# Patient Record
Sex: Male | Born: 1960 | Race: White | Hispanic: No | Marital: Married | Smoking: Former smoker
Health system: Southern US, Community
[De-identification: ages and names within clinical notes are randomized; demographics above are authoritative.]

## PROBLEM LIST (undated history)

## (undated) DIAGNOSIS — K6289 Other specified diseases of anus and rectum: Secondary | ICD-10-CM

## (undated) DIAGNOSIS — Z8719 Personal history of other diseases of the digestive system: Secondary | ICD-10-CM

## (undated) DIAGNOSIS — Z973 Presence of spectacles and contact lenses: Secondary | ICD-10-CM

## (undated) DIAGNOSIS — I10 Essential (primary) hypertension: Secondary | ICD-10-CM

## (undated) DIAGNOSIS — K649 Unspecified hemorrhoids: Secondary | ICD-10-CM

## (undated) DIAGNOSIS — Z9189 Other specified personal risk factors, not elsewhere classified: Secondary | ICD-10-CM

## (undated) DIAGNOSIS — M1990 Unspecified osteoarthritis, unspecified site: Secondary | ICD-10-CM

## (undated) DIAGNOSIS — F419 Anxiety disorder, unspecified: Secondary | ICD-10-CM

## (undated) HISTORY — PX: TONSILLECTOMY: SUR1361

---

## 2005-02-20 ENCOUNTER — Emergency Department (HOSPITAL_COMMUNITY): Admission: EM | Admit: 2005-02-20 | Discharge: 2005-02-21 | Payer: Self-pay | Admitting: Emergency Medicine

## 2007-05-11 ENCOUNTER — Emergency Department (HOSPITAL_COMMUNITY): Admission: EM | Admit: 2007-05-11 | Discharge: 2007-05-11 | Payer: Self-pay | Admitting: Emergency Medicine

## 2008-06-12 ENCOUNTER — Encounter: Admission: RE | Admit: 2008-06-12 | Discharge: 2008-06-12 | Payer: Self-pay | Admitting: Internal Medicine

## 2008-09-26 ENCOUNTER — Emergency Department (HOSPITAL_COMMUNITY): Admission: EM | Admit: 2008-09-26 | Discharge: 2008-09-26 | Payer: Self-pay | Admitting: Emergency Medicine

## 2010-11-25 NOTE — Assessment & Plan Note (Signed)
[facility]                                 ON-CALL NOTE   Cameron Kemp, Cameron Kemp                         MRN:          [id number]  DATE:05/11/2007                            DOB:          December 19, 1960    I received a call from the emergency room physician at [facility].  She  stated she had a Docia Chuck there who had been evaluated for chest  pain by her.  She stated that she felt Cameron Kemp could be safely  discharged home with an outpatient assessment by Cardiology.  I have  left a message with the office on the after-hours number that he should  be called.  Cameron Kemp stated his preferred phone number for being  reached was [phone].  The emergency room physician did not feel that he  needed further assessment or inpatient evaluation.      Theodore Demark, PA-C  Electronically Signed      Luis Abed, MD, [facility]  Electronically Signed   RB/MedQ  DD: 05/11/2007  DT: 05/12/2007  Job #: [id number]

## 2011-04-22 LAB — URINALYSIS, ROUTINE W REFLEX MICROSCOPIC
Bilirubin Urine: NEGATIVE
Hgb urine dipstick: NEGATIVE
Nitrite: NEGATIVE
Protein, ur: NEGATIVE

## 2011-04-22 LAB — CBC
Hemoglobin: 14.8
MCV: 97.7
Platelets: 168
RBC: 4.29
RDW: 12
WBC: 5.9

## 2011-04-22 LAB — POCT CARDIAC MARKERS
CKMB, poc: <1 — ABNORMAL LOW
CKMB, poc: <1 — ABNORMAL LOW
CKMB, poc: <1 — ABNORMAL LOW
Myoglobin, poc: 41.4
Myoglobin, poc: 47.1
Operator id: 4295
Operator id: 4708
Troponin i, poc: <0.05

## 2011-04-22 LAB — COMPREHENSIVE METABOLIC PANEL
Albumin: 3.7
BUN: 8
GFR calc non Af Amer: >60
Glucose, Bld: 107 — ABNORMAL HIGH
Potassium: 3.9
Total Protein: 6.7

## 2011-04-22 LAB — DIFFERENTIAL
Basophils Absolute: 0.1
Basophils Relative: 1
Eosinophils Absolute: 0.1
Eosinophils Relative: 2
Lymphs Abs: 1.8
Neutro Abs: 3.4
Neutrophils Relative %: 57

## 2011-07-17 ENCOUNTER — Emergency Department (HOSPITAL_COMMUNITY): Payer: Managed Care, Other (non HMO)

## 2011-07-17 ENCOUNTER — Encounter: Payer: Self-pay | Admitting: Cardiology

## 2011-07-17 ENCOUNTER — Other Ambulatory Visit: Payer: Self-pay

## 2011-07-17 ENCOUNTER — Emergency Department (HOSPITAL_COMMUNITY)
Admission: EM | Admit: 2011-07-17 | Discharge: 2011-07-17 | Disposition: A | Discharge status: Home / Self Care | Payer: Managed Care, Other (non HMO) | Attending: Emergency Medicine | Admitting: Emergency Medicine

## 2011-07-17 DIAGNOSIS — F17200 Nicotine dependence, unspecified, uncomplicated: Secondary | ICD-10-CM | POA: Insufficient documentation

## 2011-07-17 DIAGNOSIS — R42 Dizziness and giddiness: Secondary | ICD-10-CM

## 2011-07-17 HISTORY — DX: Essential (primary) hypertension: I10

## 2011-07-17 LAB — COMPREHENSIVE METABOLIC PANEL
AST: 26 U/L (ref 0–37)
Alkaline Phosphatase: 72 U/L (ref 39–117)
BUN: 9 mg/dL (ref 6–23)
CO2: 29 mEq/L (ref 19–32)
Chloride: 101 mEq/L (ref 96–112)
Creatinine, Ser: 0.79 mg/dL (ref 0.50–1.35)
GFR calc Af Amer: >90 mL/min (ref >90)
GFR calc non Af Amer: >90 mL/min (ref >90)
Total Bilirubin: 0.4 mg/dL (ref 0.3–1.2)

## 2011-07-17 LAB — CARDIAC PANEL(CRET KIN+CKTOT+MB+TROPI)
CK, MB: 1.8 ng/mL (ref 0.3–4.0)
Relative Index: INVALID (ref 0.0–2.5)
Total CK: 69 U/L (ref 7–232)
Troponin I: <0.3 ng/mL (ref <0.30)

## 2011-07-17 LAB — CBC
HCT: 42.3 % (ref 39.0–52.0)
Hemoglobin: 15.6 g/dL (ref 13.0–17.0)
MCHC: 36.9 g/dL — ABNORMAL HIGH (ref 30.0–36.0)
Platelets: 130 10*3/uL — ABNORMAL LOW (ref 150–400)
RBC: 4.48 MIL/uL (ref 4.22–5.81)

## 2011-07-17 LAB — DIFFERENTIAL
Eosinophils Relative: 2 % (ref 0–5)
Monocytes Absolute: 0.4 10*3/uL (ref 0.1–1.0)
Neutrophils Relative %: 59 % (ref 43–77)

## 2011-07-17 MED ORDER — MECLIZINE HCL 12.5 MG PO TABS: 12.5000 mg | ORAL_TABLET | Freq: Three times a day (TID) | ORAL | Status: AC | PRN

## 2011-07-17 MED ORDER — MECLIZINE HCL 25 MG PO TABS
25.0000 mg | ORAL_TABLET | Freq: Once | ORAL | Status: AC
  Administered 2011-07-17: 25 mg via ORAL
  Filled 2011-07-17: qty 1

## 2011-07-17 NOTE — ED Provider Notes (Signed)
History     CSN: [id number]  Arrival date & time 07/17/11  1149   First MD Initiated Contact with Patient 07/17/11 1158      Chief Complaint  Patient presents with  . Chest Pain    (Consider location/radiation/quality/duration/timing/severity/associated sxs/prior treatment) HPI Comments: Patient was drinking coffee before heading to work this morning, then suddenly developed the sensation of dizziness.  He felt this way for a while.  When the symptoms persisted he decided to come here.  He is worse when upright, relieved with rest.  He does have a headache.  There is no fever.  No ringing in the ears or hearing loss.  The history is provided by the patient.    Past Medical History  Diagnosis Date  . Hypertension     History reviewed. No pertinent past surgical history.  History reviewed. No pertinent family history.  History  Substance Use Topics  . Smoking status: Current Everyday Smoker -- 1.0 packs/day  . Smokeless tobacco: Not on file  . Alcohol Use:       Review of Systems  All other systems reviewed and are negative.    Allergies  Penicillins  Home Medications  No current outpatient prescriptions on file.  BP 140/86  Pulse 59  Temp(Src) 98.4 F (36.9 C) (Oral)  Resp 18  SpO2 95%  Physical Exam  Nursing note and vitals reviewed. Constitutional: He is oriented to person, place, and time. He appears well-developed and well-nourished. No distress.  HENT:  Head: Normocephalic and atraumatic.  Right Ear: External ear normal.  Left Ear: External ear normal.  Mouth/Throat: Oropharynx is clear and moist.  Eyes: EOM are normal. Pupils are equal, round, and reactive to light.  Neck: Normal range of motion. Neck supple.  Cardiovascular: Normal rate and regular rhythm.  Exam reveals no friction rub.   No murmur heard. Pulmonary/Chest: Effort normal and breath sounds normal. No respiratory distress. He has no wheezes.  Abdominal: Soft. Bowel sounds are  normal. He exhibits no distension. There is no tenderness.  Musculoskeletal: Normal range of motion. He exhibits no edema.  Neurological: He is alert and oriented to person, place, and time. No cranial nerve deficit. Coordination normal.  Skin: Skin is warm and dry. He is not diaphoretic.    ED Course  Procedures (including critical care time)  Labs Reviewed - No data to display No results found.   No diagnosis found.   Date: 07/17/2011  Rate: 55  Rhythm: normal sinus rhythm  QRS Axis: normal  Intervals: normal  ST/T Wave abnormalities: normal  Conduction Disutrbances:none  Narrative Interpretation:   Old EKG Reviewed: none available    MDM  Labs, ekg are okay.  Patient feeling much better.  Symptoms sound more like peripheral vertigo than cardiac.  Had negative stress test 2 years ago.  Will discharge to home, to return prn if worsens.          Geoffery Lyons, MD 07/17/11 1415

## 2011-07-17 NOTE — ED Notes (Signed)
Pt to department via EMS from home c/o chest pain that started this morning. Reports that he went home from work and did not feel any better. Pt denies any n/v or diaphoresis with the pain. Bp- 130/70 Hr- NSR 342 ASA and 1 nitro PTA.

## 2013-10-30 ENCOUNTER — Other Ambulatory Visit: Payer: Self-pay | Admitting: Internal Medicine

## 2013-10-30 DIAGNOSIS — R7401 Elevation of levels of liver transaminase levels: Secondary | ICD-10-CM

## 2013-10-30 DIAGNOSIS — R740 Nonspecific elevation of levels of transaminase and lactic acid dehydrogenase [LDH]: Principal | ICD-10-CM

## 2015-07-06 ENCOUNTER — Ambulatory Visit (INDEPENDENT_AMBULATORY_CARE_PROVIDER_SITE_OTHER): Payer: 59 | Admitting: Family Medicine

## 2015-07-06 VITALS — BP 132/80 | HR 67 | Temp 98.1°F | Resp 18 | Ht 71.0 in | Wt 227.0 lb

## 2015-07-06 DIAGNOSIS — H6691 Otitis media, unspecified, right ear: Secondary | ICD-10-CM

## 2015-07-06 DIAGNOSIS — J209 Acute bronchitis, unspecified: Secondary | ICD-10-CM | POA: Diagnosis not present

## 2015-07-06 DIAGNOSIS — R0981 Nasal congestion: Secondary | ICD-10-CM

## 2015-07-06 DIAGNOSIS — R059 Cough, unspecified: Secondary | ICD-10-CM

## 2015-07-06 DIAGNOSIS — H9203 Otalgia, bilateral: Secondary | ICD-10-CM | POA: Diagnosis not present

## 2015-07-06 DIAGNOSIS — R05 Cough: Secondary | ICD-10-CM | POA: Diagnosis not present

## 2015-07-06 MED ORDER — AZITHROMYCIN 250 MG PO TABS: ORAL_TABLET | ORAL | Status: DC

## 2015-07-06 MED ORDER — BENZONATATE 100 MG PO CAPS: 200.0000 mg | ORAL_CAPSULE | Freq: Two times a day (BID) | ORAL | Status: DC | PRN

## 2015-07-06 NOTE — Progress Notes (Signed)
Chief Complaint:  Chief Complaint  Patient presents with  . Facial Swelling    rt side of face noticed today     HPI: Cameron Kemp is a 54 y.o. male who reports to [facility] today complaining of right sided jaw pain and ear pain with prior 2 week hx of sinusitis and bronchitis. No dental cavities.  He has had no fevers or chill. No swollowing issues. No CP or SOB or voice changes. No strange taste in his mouth He is a smoker.   Past Medical History  Diagnosis Date  . Hypertension   . Emphysema of lung [facility])    Past Surgical History  Procedure Laterality Date  . Tonsillectomy     Social History   Social History  . Marital Status: Single    Spouse Name: N/A  . Number of Children: N/A  . Years of Education: N/A   Social History Main Topics  . Smoking status: Current Every Day Smoker -- 1.00 packs/day  . Smokeless tobacco: None  . Alcohol Use: None  . Drug Use: None  . Sexual Activity: Not Asked   Other Topics Concern  . None   Social History Narrative   Family History  Problem Relation Age of Onset  . Hyperlipidemia Brother   . Hypertension Brother   . Stroke Paternal Grandmother   . Hypertension Brother    Allergies  Allergen Reactions  . Penicillins Rash   Prior to Admission medications   Medication Sig Start Date End Date Taking? Authorizing Provider  hydrochlorothiazide (HYDRODIURIL) 50 MG tablet Take 25 mg by mouth daily.     Yes Historical Provider, MD  metoprolol tartrate (LOPRESSOR) 25 MG tablet Take 25 mg by mouth 2 (two) times daily.     Yes Historical Provider, MD  aspirin EC 81 MG tablet Take 81 mg by mouth daily. Reported on 07/06/2015    Historical Provider, MD  azithromycin (ZITHROMAX) 250 MG tablet Take 2 tabs po now then 1 tab po daily for the next 4 days. 07/06/15   Marbella Markgraf P Kenson Groh, DO  benzonatate (TESSALON) 100 MG capsule Take 2 capsules (200 mg total) by mouth 2 (two) times daily as needed. 07/06/15   Sid Greener P Hadas Jessop, DO  diazepam (VALIUM) 5 MG  tablet Take 2.5 mg by mouth daily as needed. Reported on 07/06/2015    Historical Provider, MD     ROS: The patient denies fevers, chills, night sweats, unintentional weight loss, chest pain, palpitations, wheezing, dyspnea on exertion, nausea, vomiting, abdominal pain, dysuria, hematuria, melena, numbness, weakness, or tingling.  All other systems have been reviewed and were otherwise negative with the exception of those mentioned in the HPI and as above.    PHYSICAL EXAM: Filed Vitals:   07/06/15 1335  BP: 132/80  Pulse: 67  Temp: 98.1 F (36.7 C)  Resp: 18   Body mass index is 31.67 kg/(m^2).   General: Alert, no acute distress HEENT:  Normocephalic, atraumatic, oropharynx patent. EOMI, PERRLA Erythematous throat, no exudates, left TM normal, right TM erythematous, + sinus tenderness, + erythematous/boggy nasal mucosa, + dental caries, no gingival swelling Cardiovascular:  Regular rate and rhythm, no rubs murmurs or gallops.  No Carotid bruits, radial pulse intact. No pedal edema.  Respiratory: Clear to auscultation bilaterally.  No wheezes, rales, or rhonchi.  No cyanosis, no use of accessory musculature Abdominal: No organomegaly, abdomen is soft and non-tender, positive bowel sounds. No masses. Skin: + slight swelling around right ear and  also erythema , no e/o abscess Neurologic: Facial musculature symmetric. Psychiatric: Patient acts appropriately throughout our interaction. Lymphatic: No cervical or submandibular lymphadenopathy Musculoskeletal: Gait intact. No edema, tenderness   LABS: Results for orders placed or performed during the hospital encounter of 07/17/11  CBC  Result Value Ref Range   WBC 5.2 4.0 - 10.5 K/uL   RBC 4.48 4.22 - 5.81 MIL/uL   Hemoglobin 15.6 13.0 - 17.0 g/dL   HCT 42.3 39.0 - 52.0 %   MCV 94.4 78.0 - 100.0 fL   MCH 34.8 (H) 26.0 - 34.0 pg   MCHC 36.9 (H) 30.0 - 36.0 g/dL   RDW 11.7 11.5 - 15.5 %   Platelets 130 (L) 150 - 400 K/uL    Differential  Result Value Ref Range   Neutrophils Relative % 59 43 - 77 %   Neutro Abs 3.1 1.7 - 7.7 K/uL   Lymphocytes Relative 30 12 - 46 %   Lymphs Abs 1.6 0.7 - 4.0 K/uL   Monocytes Relative 8 3 - 12 %   Monocytes Absolute 0.4 0.1 - 1.0 K/uL   Eosinophils Relative 2 0 - 5 %   Eosinophils Absolute 0.1 0.0 - 0.7 K/uL   Basophils Relative 1 0 - 1 %   Basophils Absolute 0.0 0.0 - 0.1 K/uL  Comprehensive metabolic panel  Result Value Ref Range   Sodium 138 135 - 145 mEq/L   Potassium 3.8 3.5 - 5.1 mEq/L   Chloride 101 96 - 112 mEq/L   CO2 29 19 - 32 mEq/L   Glucose, Bld 107 (H) 70 - 99 mg/dL   BUN 9 6 - 23 mg/dL   Creatinine, Ser 0.79 0.50 - 1.35 mg/dL   Calcium 9.1 8.4 - 10.5 mg/dL   Total Protein 7.1 6.0 - 8.3 g/dL   Albumin 3.9 3.5 - 5.2 g/dL   AST 26 0 - 37 U/L   ALT 37 0 - 53 U/L   Alkaline Phosphatase 72 39 - 117 U/L   Total Bilirubin 0.4 0.3 - 1.2 mg/dL   GFR calc non Af Amer >90 >90 mL/min   GFR calc Af Amer >90 >90 mL/min  Cardiac panel(cret kin+cktot+mb+tropi)  Result Value Ref Range   Total CK 69 7 - 232 U/L   CK, MB 1.8 0.3 - 4.0 ng/mL   Troponin I <0.30 <0.30 ng/mL   Relative Index RELATIVE INDEX IS INVALID 0.0 - 2.5     EKG/XRAY:   Primary read interpreted by Dr. Marin Comment at [facility].   ASSESSMENT/PLAN: Encounter Diagnoses  Name Primary?  . Acute bronchitis, unspecified organism Yes  . Sinus congestion   . Otalgia, bilateral   . Acute right otitis media, recurrence not specified, unspecified otitis media type   . Cough    He was recently ill and has a lot of sinus pressure and now has ear pain and some swelling Will presumptively treat for early OM sine TM looks dull and erythematous He has dental caries and has some pain with certain foods/liquids but nothing new PCN allergy and since I think this is sinus related will rx z pack and tessalon perles Fu prn   Gross sideeffects, risk and benefits, and alternatives of medications d/w patient. Patient is  aware that all medications have potential sideeffects and we are unable to predict every sideeffect or drug-drug interaction that may occur.  Hadas Jessop DO  07/06/2015 3:15 PM

## 2015-07-19 ENCOUNTER — Other Ambulatory Visit (HOSPITAL_COMMUNITY): Payer: Self-pay | Admitting: Respiratory Therapy

## 2015-07-19 DIAGNOSIS — R0600 Dyspnea, unspecified: Secondary | ICD-10-CM

## 2015-07-26 ENCOUNTER — Ambulatory Visit (HOSPITAL_COMMUNITY)
Admission: RE | Admit: 2015-07-26 | Discharge: 2015-07-26 | Disposition: A | Discharge status: Home / Self Care | Payer: 59 | Source: Ambulatory Visit | Attending: Internal Medicine | Admitting: Internal Medicine

## 2015-07-26 DIAGNOSIS — R0600 Dyspnea, unspecified: Secondary | ICD-10-CM | POA: Insufficient documentation

## 2015-07-26 MED ORDER — ALBUTEROL SULFATE (2.5 MG/3ML) 0.083% IN NEBU
2.5000 mg | INHALATION_SOLUTION | Freq: Once | RESPIRATORY_TRACT | Status: AC
  Administered 2015-07-26: 2.5 mg via RESPIRATORY_TRACT

## 2015-07-31 LAB — PULMONARY FUNCTION TEST
DL/VA % PRED: 106 %
DL/VA: 5.02 ml/min/mmHg/L
DLCO UNC % PRED: 79 %
DLCO UNC: 26.63 ml/min/mmHg
FEF 25-75 Post: 1.37 L/sec
FEF 25-75 Pre: 0.75 L/sec
FEF2575-%Change-Post: 83 %
FEF2575-%Pred-Post: 39 %
FEF2575-%Pred-Pre: 21 %
FEV1-%CHANGE-POST: 20 %
FEV1-%PRED-PRE: 47 %
FEV1-%Pred-Post: 57 %
FEV1-POST: 2.3 L
FEV1-Pre: 1.9 L
FEV1FVC-%Change-Post: 3 %
FEV1FVC-%Pred-Pre: 69 %
FEV6-%Change-Post: 13 %
FEV6-%PRED-POST: 74 %
FEV6-%Pred-Pre: 65 %
FEV6-POST: 3.69 L
FEV6-PRE: 3.25 L
FEV6FVC-%CHANGE-POST: -2 %
FEV6FVC-%PRED-POST: 93 %
FEV6FVC-%PRED-PRE: 95 %
FVC-%Change-Post: 16 %
FVC-%Pred-Post: 79 %
FVC-%Pred-Pre: 68 %
FVC-Post: 4.12 L
FVC-Pre: 3.54 L
POST FEV6/FVC RATIO: 90 %
PRE FEV6/FVC RATIO: 92 %
Post FEV1/FVC ratio: 56 %
Pre FEV1/FVC ratio: 54 %
RV % PRED: 157 %
RV: 3.38 L
TLC % PRED: 98 %
TLC: 7.04 L

## 2015-08-06 ENCOUNTER — Emergency Department (HOSPITAL_COMMUNITY): Payer: 59

## 2015-08-06 ENCOUNTER — Encounter (HOSPITAL_COMMUNITY): Payer: Self-pay | Admitting: Emergency Medicine

## 2015-08-06 ENCOUNTER — Emergency Department (HOSPITAL_COMMUNITY)
Admission: EM | Admit: 2015-08-06 | Discharge: 2015-08-06 | Disposition: A | Discharge status: Home / Self Care | Payer: 59 | Attending: Emergency Medicine | Admitting: Emergency Medicine

## 2015-08-06 DIAGNOSIS — R42 Dizziness and giddiness: Secondary | ICD-10-CM | POA: Insufficient documentation

## 2015-08-06 DIAGNOSIS — Z79899 Other long term (current) drug therapy: Secondary | ICD-10-CM | POA: Diagnosis not present

## 2015-08-06 DIAGNOSIS — I10 Essential (primary) hypertension: Secondary | ICD-10-CM | POA: Insufficient documentation

## 2015-08-06 DIAGNOSIS — F17200 Nicotine dependence, unspecified, uncomplicated: Secondary | ICD-10-CM | POA: Diagnosis not present

## 2015-08-06 DIAGNOSIS — Z8709 Personal history of other diseases of the respiratory system: Secondary | ICD-10-CM | POA: Insufficient documentation

## 2015-08-06 DIAGNOSIS — Z880 Allergy status to penicillin: Secondary | ICD-10-CM | POA: Diagnosis not present

## 2015-08-06 DIAGNOSIS — Z7982 Long term (current) use of aspirin: Secondary | ICD-10-CM | POA: Diagnosis not present

## 2015-08-06 DIAGNOSIS — R079 Chest pain, unspecified: Secondary | ICD-10-CM | POA: Diagnosis not present

## 2015-08-06 LAB — BASIC METABOLIC PANEL
Anion gap: 9 (ref 5–15)
BUN: 7 mg/dL (ref 6–20)
CO2: 32 mmol/L (ref 22–32)
Calcium: 9.8 mg/dL (ref 8.9–10.3)
Chloride: 95 mmol/L — ABNORMAL LOW (ref 101–111)
Creatinine, Ser: 0.82 mg/dL (ref 0.61–1.24)
GFR calc Af Amer: >60 mL/min (ref >60)
GFR calc non Af Amer: >60 mL/min (ref >60)
Glucose, Bld: 114 mg/dL — ABNORMAL HIGH (ref 65–99)
Potassium: 4.1 mmol/L (ref 3.5–5.1)
Sodium: 136 mmol/L (ref 135–145)

## 2015-08-06 LAB — CBC
HCT: 48.7 % (ref 39.0–52.0)
Hemoglobin: 17 g/dL (ref 13.0–17.0)
MCH: 34 pg (ref 26.0–34.0)
MCHC: 34.9 g/dL (ref 30.0–36.0)
MCV: 97.4 fL (ref 78.0–100.0)
Platelets: 156 10*3/uL (ref 150–400)
RBC: 5 MIL/uL (ref 4.22–5.81)
RDW: 12.1 % (ref 11.5–15.5)
WBC: 6.5 10*3/uL (ref 4.0–10.5)

## 2015-08-06 LAB — I-STAT TROPONIN, ED: Troponin i, poc: 0 ng/mL (ref 0.00–0.08)

## 2015-08-06 NOTE — ED Notes (Signed)
Pt reports cp radiating to arms for "some time now" but sts it got worse today causing him to come home early from work. sts he also got dizzy and felt like he was going to pass out.

## 2015-08-06 NOTE — Discharge Instructions (Signed)
I recommend you start taking a baby aspirin a day and follow up with your cardiologist. Return for any chest pain that last 15 or 20 minutes or longer.

## 2015-08-06 NOTE — ED Notes (Signed)
MD AT PATIENT BEDSIDE AT THIS TIME

## 2015-08-06 NOTE — ED Notes (Signed)
Provided patient with discharge instructions. Pt's visitor (wife?) verbally aggressive to RN at time of discharge. States, "take that IV out that he didn't need anyway, it better not show up on our bill." RN apologized. Pt then began to yell at visitor, then they laughed and apologized to RN. Patient left at this time with all belongings, refused wheelchair.

## 2015-08-06 NOTE — ED Notes (Signed)
Pt's visitor upset with RN d/t wait times, reassured that MD is looking over patient's chart and will be in to see him as soon as he has settled things with other patients. Let patient and family know that a full cardiac work up can take 4-5 hours, especially in a patient with high risk factors. Visitor seems content with this at this time.

## 2015-08-06 NOTE — ED Provider Notes (Signed)
CSN: [id number]     Arrival date & time 08/06/15  1804 History   First MD Initiated Contact with Patient 08/06/15 2119     Chief Complaint  Patient presents with  . Chest Pain  . Dizziness     (Consider location/radiation/quality/duration/timing/severity/associated sxs/prior Treatment) Patient is a 55 y.o. male presenting with chest pain and dizziness. The history is provided by the patient.  Chest Pain Associated symptoms: dizziness   Associated symptoms: no abdominal pain, no back pain, no diaphoresis, no fever, no nausea, no numbness, no shortness of breath, not vomiting and no weakness   Dizziness Associated symptoms: chest pain   Associated symptoms: no nausea, no shortness of breath, no vomiting and no weakness    patient currently being followed by cardiology was referred there by Dr. Maudie Mercury. Patient's been having difficulty for about 2 weeks with the wording pressure sensation in the mid upper chest area. With questionable radiation to the left arm. Patient states he's had discomfort in the left arm for months. Patient quit smoking last night at midnight was his last cigarette. Today patient had episode of dizziness nausea and feeling like he was going to pass out. But did not pass out. His had intermittent chest discomfort lasting at most 3 minutes.  Past Medical History  Diagnosis Date  . Hypertension   . Emphysema of lung [facility])    Past Surgical History  Procedure Laterality Date  . Tonsillectomy     Family History  Problem Relation Age of Onset  . Hyperlipidemia Brother   . Hypertension Brother   . Stroke Paternal Grandmother   . Hypertension Brother    Social History  Substance Use Topics  . Smoking status: Current Every Day Smoker -- 1.00 packs/day  . Smokeless tobacco: None  . Alcohol Use: Yes    Review of Systems  Constitutional: Negative for fever and diaphoresis.  HENT: Negative for congestion.   Respiratory: Negative for shortness of breath.    Cardiovascular: Positive for chest pain.  Gastrointestinal: Negative for nausea, vomiting and abdominal pain.  Genitourinary: Negative for dysuria.  Musculoskeletal: Negative for back pain.  Neurological: Positive for dizziness and light-headedness. Negative for syncope, weakness and numbness.  Hematological: Does not bruise/bleed easily.  Psychiatric/Behavioral: Negative for confusion.      Allergies  Penicillins  Home Medications   Prior to Admission medications   Medication Sig Start Date End Date Taking? Authorizing Provider  aspirin EC 81 MG tablet Take 81 mg by mouth daily. Reported on 07/06/2015   Yes Historical Provider, MD  buPROPion (WELLBUTRIN) 75 MG tablet Take 75 mg by mouth 2 (two) times daily.   Yes Historical Provider, MD  hydrochlorothiazide (HYDRODIURIL) 50 MG tablet Take 25 mg by mouth daily.     Yes Historical Provider, MD  metoprolol tartrate (LOPRESSOR) 25 MG tablet Take 25 mg by mouth 2 (two) times daily.     Yes Historical Provider, MD  azithromycin (ZITHROMAX) 250 MG tablet Take 2 tabs po now then 1 tab po daily for the next 4 days. Patient not taking: Reported on 08/06/2015 07/06/15   Thao P Le, DO  benzonatate (TESSALON) 100 MG capsule Take 2 capsules (200 mg total) by mouth 2 (two) times daily as needed. Patient not taking: Reported on 08/06/2015 07/06/15   Thao P Le, DO  diazepam (VALIUM) 5 MG tablet Take 2.5 mg by mouth daily as needed. Reported on 08/06/2015    Historical Provider, MD   BP 133/86 mmHg  Pulse 66  Temp(Src) 98.5 F (36.9 C) (Oral)  Resp 19  Ht 5\' 10"  (1.778 m)  Wt 102.059 kg  BMI 32.28 kg/m2  SpO2 94% Physical Exam  Constitutional: He is oriented to person, place, and time. He appears well-developed and well-nourished. No distress.  HENT:  Head: Normocephalic.  Mouth/Throat: Oropharynx is clear and moist.  Eyes: Conjunctivae and EOM are normal. Pupils are equal, round, and reactive to light.  Neck: Normal range of motion.   Cardiovascular: Normal rate, regular rhythm and normal heart sounds.   Pulmonary/Chest: Effort normal and breath sounds normal. No respiratory distress.  Abdominal: Soft. Bowel sounds are normal. There is no tenderness.  Musculoskeletal: Normal range of motion.  Neurological: He is alert and oriented to person, place, and time. No cranial nerve deficit. He exhibits normal muscle tone. Coordination normal.  Skin: Skin is warm. No erythema.  Nursing note and vitals reviewed.   ED Course  Procedures (including critical care time) Labs Review Labs Reviewed  BASIC METABOLIC PANEL - Abnormal; Notable for the following:    Chloride 95 (*)    Glucose, Bld 114 (*)    All other components within normal limits  CBC  I-STAT TROPOININ, ED    Imaging Review Dg Chest 2 View  08/06/2015  CLINICAL DATA:  55 year old male with left chest pain for 2 weeks. EXAM: CHEST  2 VIEW COMPARISON:  07/19/2015 and prior exams FINDINGS: The cardiomediastinal silhouette is unremarkable. COPD/ emphysema changes identified. There is no evidence of focal airspace disease, pulmonary edema, suspicious pulmonary nodule/mass, pleural effusion, or pneumothorax. No acute bony abnormalities are identified. IMPRESSION: COPD/emphysema without evidence of acute cardiopulmonary disease. Electronically Signed   By: Margarette Canada M.D.   On: 08/06/2015 18:54   I have personally reviewed and evaluated these images and lab results as part of my medical decision-making.   EKG Interpretation   Date/Time:  Tuesday August 06 2015 18:12:39 EST Ventricular Rate:  60 PR Interval:  164 QRS Duration: 88 QT Interval:  398 QTC Calculation: 398 R Axis:   82 Text Interpretation:  Normal sinus rhythm Normal ECG Confirmed by  Zaineb Nowaczyk  MD, Orphia Mctigue ([id number]) on 08/06/2015 9:36:18 PM      MDM   Final diagnoses:  Chest pain, unspecified chest pain type    Patient currently being followed by cardiology for chest pain. Patient's had 2 weeks  of chest pain similar to what occurred today and had pain in his left arm for a long time. Today's chest pain did seem to radiate more to the left arm. None of the pain is lasted over 3 minutes. Intermittent in nature. He is currently being followed by cardiology has a stress test scheduled. He was referred to them for this chest pain. Patient quit smoking last night at midnight today he had a little bit of nausea and dizziness and felt like he was going to pass out but did not pass out. No significant shortness of breath.   Troponin was negative chest x-ray no acute findings. EKG normal sinus rhythm no acute findings at all.  Patient's chest pain seems to be inconsistent with an acute cardiac event. Patient given precautions to return for any chest pain lasting 15 minutes or longer. Patient will continue his follow-up with his cardiologist for the stress test. Patient will start a baby aspirin a day. Some of the symptoms may have been related to his recent stopping smoking. Patient now nontoxic no acute distress.  Fredia Sorrow, MD 08/06/15 2251

## 2015-08-06 NOTE — ED Notes (Signed)
pts wife upset regarding wait time, states that he has been skipped. Explained how timer starts at check in and there are still 3 people before him. Wife states that is not true, that he has been skipped. Explained wait time again, apologized for length of wait.

## 2015-09-20 ENCOUNTER — Other Ambulatory Visit: Payer: Self-pay | Admitting: Internal Medicine

## 2015-09-20 DIAGNOSIS — R945 Abnormal results of liver function studies: Secondary | ICD-10-CM

## 2016-06-30 ENCOUNTER — Other Ambulatory Visit: Payer: Self-pay | Admitting: Internal Medicine

## 2016-06-30 DIAGNOSIS — R945 Abnormal results of liver function studies: Secondary | ICD-10-CM

## 2016-08-13 HISTORY — PX: COLONOSCOPY: SHX174

## 2016-09-08 ENCOUNTER — Ambulatory Visit: Payer: Self-pay | Admitting: Surgery

## 2016-09-08 NOTE — H&P (Signed)
Cameron Kemp 09/07/2016 4:32 PM Location: [facility] Patient #: [id number] DOB: 07/08/1961 Married / Language: Cleophus Molt / Race: White Male  History of Present Illness Adin Hector MD; 09/08/2016 8:02 AM) The patient is a 56 year old male who presents with a colorectal polyp. Note for "Colorectal polyp": Patient sent for surgical consultation at the request of Dr. Carol Ada with gastroenterology. Concern for distal rectal polyp.  Pleasant 56 year old male one. History of intermittent rectal bleeding. Underwent colonoscopy with numerous polypectomies done. Found to have a mass in his distal rectum. Also symptomatic hemorrhoids. Surgical consultation recommended for removal. Rest of the polyps patient recalls being told were adenomatous and benign. Patient notes his rectal bleeding has gone down since the polypectomies. However he has known as the lump popping out of the anus. He recalls something popping out intermittently for the past decade. He usually moves his bowels once or twice a day. He has never had any treatments for hemorrhoids. No banding or injection. Tries to do occasional wet wipes. He had some mild dyspnea on exertion. Negative pulmonary and cardiac workup. Quit smoking a year ago. Hypertension controlled.  Basic concerns of a prolapsing anal mass that was too sensitive to remove by colonoscopy, surgical consultation requested for removal. No personal nor family history of GI/colon cancer, inflammatory bowel disease, irritable bowel syndrome, allergy such as Celiac Sprue, dietary/dairy problems, colitis, ulcers nor gastritis. No recent sick contacts/gastroenteritis. No travel outside the country. No changes in diet. No dysphagia to solids or liquids. No significant heartburn or reflux. No hematochezia, hematemesis, coffee ground emesis. No evidence of prior gastric/peptic ulceration.  Patient also recalls being told he had a hernia in his  left groin. He is noticed a hernia at his bellybutton as well. He has had that for a while. He thinks the one at the bellybutton has become slightly larger. Gets some intermittent sharp pains in his groins but nothing too severe. That has been going on for the past couple years. He wonders if at some point he should get hernias repaired as well. It is not a priority compared to the prolapsing anal mass   Past Surgical History (April Staton, [place]; 09/07/2016 4:32 PM) Colon Polyp Removal - Colonoscopy Oral Surgery  Diagnostic Studies History (April Staton, [place]; 09/07/2016 4:32 PM) Colonoscopy within last year  Allergies (April Staton, CMA; 09/07/2016 4:33 PM) Penicillins  Medication History (April Staton, CMA; 09/07/2016 4:34 PM) Metoprolol Tartrate (25MG  Tablet, Oral) Active. HydroCHLOROthiazide (25MG  Tablet, Oral) Active. LORazepam (0.5MG  Tablet, Oral) Active. Medications Reconciled  Social History (April Staton, CMA; 09/07/2016 4:32 PM) Alcohol use Moderate alcohol use. Caffeine use Coffee. Illicit drug use Remotely quit drug use. Tobacco use Former smoker.  Family History (April Staton, [place]; 09/07/2016 4:32 PM) Alcohol Abuse Father. Arthritis Brother, Mother. Colon Polyps Brother, Mother. Hypertension Brother, Mother.  Other Problems (April Staton, CMA; 09/07/2016 4:32 PM) Anxiety Disorder Arthritis Back Pain Gastroesophageal Reflux Disease Hemorrhoids High blood pressure Inguinal Hernia Umbilical Hernia Repair     Review of Systems (April Staton CMA; 09/07/2016 4:32 PM) General Present- Night Sweats and Weight Gain. Not Present- Appetite Loss, Chills, Fatigue, Fever and Weight Loss. Skin Present- Change in Wart/Mole and Dryness. Not Present- Hives, Jaundice, New Lesions, Non-Healing Wounds, Rash and Ulcer. HEENT Present- Ringing in the Ears, Seasonal Allergies, Sinus Pain and Wears glasses/contact lenses. Not Present- Earache, Hearing Loss,  Hoarseness, Nose Bleed, Oral Ulcers, Sore Throat, Visual Disturbances and Yellow Eyes. Respiratory Present- Snoring. Not Present- Bloody sputum, Chronic  Cough, Difficulty Breathing and Wheezing. Breast Not Present- Breast Mass, Breast Pain, Nipple Discharge and Skin Changes. Cardiovascular Not Present- Chest Pain, Difficulty Breathing Lying Down, Leg Cramps, Palpitations, Rapid Heart Rate, Shortness of Breath and Swelling of Extremities. Gastrointestinal Present- Bloody Stool and Hemorrhoids. Not Present- Abdominal Pain, Bloating, Change in Bowel Habits, Chronic diarrhea, Constipation, Difficulty Swallowing, Excessive gas, Gets full quickly at meals, Indigestion, Nausea, Rectal Pain and Vomiting. Male Genitourinary Present- Nocturia. Not Present- Blood in Urine, Change in Urinary Stream, Frequency, Impotence, Painful Urination, Urgency and Urine Leakage. Musculoskeletal Present- Back Pain, Joint Pain, Joint Stiffness and Muscle Pain. Not Present- Muscle Weakness and Swelling of Extremities. Neurological Present- Tingling. Not Present- Decreased Memory, Fainting, Headaches, Numbness, Seizures, Tremor, Trouble walking and Weakness. Psychiatric Present- Anxiety. Not Present- Bipolar, Change in Sleep Pattern, Depression, Fearful and Frequent crying. Endocrine Not Present- Cold Intolerance, Excessive Hunger, Hair Changes, Heat Intolerance, Hot flashes and New Diabetes. Hematology Not Present- Blood Thinners, Easy Bruising, Excessive bleeding, Gland problems, HIV and Persistent Infections.  Vitals (April Staton CMA; 09/07/2016 4:34 PM) 09/07/2016 4:34 PM Weight: 233.25 lb Height: 70.5in Body Surface Area: 2.24 m Body Mass Index: 32.99 kg/m  Temp.: 98.28F(Oral)  Pulse: 63 (Regular)  BP: 122/82 (Sitting, Left Arm, Standard)      Physical Exam Adin Hector MD; 09/08/2016 8:03 AM)  General Mental Status-Alert. General Appearance-Not in acute distress, Not  Sickly. Orientation-Oriented X3. Hydration-Well hydrated. Voice-Normal.  Integumentary Global Assessment Upon inspection and palpation of skin surfaces of the - Axillae: non-tender, no inflammation or ulceration, no drainage. and Distribution of scalp and body hair is normal. General Characteristics Temperature - normal warmth is noted.  Head and Neck Head-normocephalic, atraumatic with no lesions or palpable masses. Face Global Assessment - atraumatic, no absence of expression. Neck Global Assessment - no abnormal movements, no bruit auscultated on the right, no bruit auscultated on the left, no decreased range of motion, non-tender. Trachea-midline. Thyroid Gland Characteristics - non-tender.  Eye Eyeball - Left-Extraocular movements intact, No Nystagmus. Eyeball - Right-Extraocular movements intact, No Nystagmus. Cornea - Left-No Hazy. Cornea - Right-No Hazy. Sclera/Conjunctiva - Left-No scleral icterus, No Discharge. Sclera/Conjunctiva - Right-No scleral icterus, No Discharge. Pupil - Left-Direct reaction to light normal. Pupil - Right-Direct reaction to light normal.  ENMT Ears Pinna - Left - no drainage observed, no generalized tenderness observed. Right - no drainage observed, no generalized tenderness observed. Nose and Sinuses External Inspection of the Nose - no destructive lesion observed. Inspection of the nares - Left - quiet respiration. Right - quiet respiration. Mouth and Throat Lips - Upper Lip - no fissures observed, no pallor noted. Lower Lip - no fissures observed, no pallor noted. Nasopharynx - no discharge present. Oral Cavity/Oropharynx - Tongue - no dryness observed. Oral Mucosa - no cyanosis observed. Hypopharynx - no evidence of airway distress observed.  Chest and Lung Exam Inspection Movements - Normal and Symmetrical. Accessory muscles - No use of accessory muscles in breathing. Palpation Palpation of the chest reveals  - Non-tender. Auscultation Breath sounds - Normal and Clear.  Cardiovascular Auscultation Rhythm - Regular. Murmurs & Other Heart Sounds - Auscultation of the heart reveals - No Murmurs and No Systolic Clicks.  Abdomen Inspection Inspection of the abdomen reveals - No Visible peristalsis and No Abnormal pulsations. Umbilicus - No Bleeding, No Urine drainage. Palpation/Percussion Palpation and Percussion of the abdomen reveal - Soft, Non Tender, No Rebound tenderness, No Rigidity (guarding) and No Cutaneous hyperesthesia. Note: Abdomen soft. Nontender, nondistended. No guarding.  Mild diastasis. 2cm periumbilical mass partially reducible consistent with at least umbilical and possible small Swiss cheese ventral type hernia  Male Genitourinary Sexual Maturity Tanner 5 - Adult hair pattern and Adult penile size and shape. Note: Subtle impulse left greater than right suspicious for bilateral inguinal hernias.   Normal external genitalia. Epididymi, testes, and spermatic cords normal without any masses.  Rectal Note: Bulky right posterior internal hemorrhoid grade 3 with pedunculated smooth polyp on it that prolapses easily. Grade 2 right anterior and left lateral hemorrhoids as well. No other rectal masses.  Perianal skin clean with good hygiene. No pruritis ani. No pilonidal disease. No fissure. No abscess/fistula. Normal sphincter tone.  No external hemorrhoids. No condyloma warts. Tolerates digital and anoscopic rectal exam.  Peripheral Vascular Upper Extremity Inspection - Left - No Cyanotic nailbeds, Not Ischemic. Right - No Cyanotic nailbeds, Not Ischemic.  Neurologic Neurologic evaluation reveals -normal attention span and ability to concentrate, able to name objects and repeat phrases. Appropriate fund of knowledge , normal sensation and normal coordination. Mental Status Affect - not angry, not paranoid. Cranial Nerves-Normal  Bilaterally. Gait-Normal.  Neuropsychiatric Mental status exam performed with findings of-able to articulate well with normal speech/language, rate, volume and coherence, thought content normal with ability to perform basic computations and apply abstract reasoning and no evidence of hallucinations, delusions, obsessions or homicidal/suicidal ideation.  Musculoskeletal Global Assessment Spine, Ribs and Pelvis - no instability, subluxation or laxity. Right Upper Extremity - no instability, subluxation or laxity.  Lymphatic Head & Neck  General Head & Neck Lymphatics: Bilateral - Description - No Localized lymphadenopathy. Axillary  General Axillary Region: Bilateral - Description - No Localized lymphadenopathy. Femoral & Inguinal  Generalized Femoral & Inguinal Lymphatics: Left - Description - No Localized lymphadenopathy. Right - Description - No Localized lymphadenopathy.   Results Adin Hector MD; 09/08/2016 8:06 AM) Procedures  Name Value Date Hemorrhoids Procedure Anal exam: External Hemorrhoid Internal exam: Internal Hemorroids ( non-bleeding) prolapse Mass Other: Right posterior grade 3 prolapsing internal hemorrhoid with pedunculated mass on it. Most likely a fibroepithelial polyp could be adenomatous. Right anterior and left lateral grade 2 internal hemorrhoids friable  Performed: 09/07/2016 4:55 PM    Assessment & Plan Adin Hector MD; 09/08/2016 8:06 AM)  PROLAPSED INTERNAL HEMORRHOIDS, GRADE 3 (K64.2) Impression: Rather large right posterior internal hemorrhoid prolapsing with pedunculated mass on it. I think this will require removal. Can most likely due to hemorrhoidal ligation and pexy on the other two hemorrhoids. Should be outpatient surgery. He is interested in proceeding.  Current Plans Pt Education - [facility] Hemorrhoids (Kameisha Malicki): discussed with patient and provided information. Pt Education - Pamphlet Given - The Hemorrhoid Book:  discussed with patient and provided information. You are being scheduled for surgery- Our schedulers will call you.  You should hear from our office's scheduling department within 5 working days about the location, date, and time of surgery. We try to make accommodations for patient's preferences in scheduling surgery, but sometimes the OR schedule or the surgeon's schedule prevents [place] from making those accommodations.  If you have not heard from our office [phone]) in 5 working days, call the office and ask for your surgeon's nurse.  If you have other questions about your diagnosis, plan, or surgery, call the office and ask for your surgeon's nurse.  The anatomy & physiology of the anorectal region was discussed. The pathophysiology of hemorrhoids and differential diagnosis was discussed. Natural history risks without surgery was discussed. I stressed the  importance of a bowel regimen to have daily soft bowel movements to minimize progression of disease. Interventions such as sclerotherapy & banding were discussed.  The patient's symptoms are not adequately controlled by medicines and other non-operative treatments. I feel the risks & problems of no surgery outweigh the operative risks; therefore, I recommended surgery to treat the hemorrhoids by ligation, pexy, and possible resection.  Risks such as bleeding, infection, urinary difficulties, need for further treatment, heart attack, death, and other risks were discussed. I noted a good likelihood this will help address the problem. Goals of post-operative recovery were discussed as well. Possibility that this will not correct all symptoms was explained. Post-operative pain, bleeding, constipation, and other problems after surgery were discussed. We will work to minimize complications. Educational handouts further explaining the pathology, treatment options, and bowel regimen were given as well. Questions were answered. The  patient expresses understanding & wishes to proceed with surgery.  ANOSCOPY, DIAGNOSTIC (46600) PROLAPSED INTERNAL HEMORRHOIDS, GRADE 2 (K64.1) Impression: Right anterior left lateral piles. Hopefully just hemorrhoidal ligation and pexy is adequate for them.  ANAL POLYP (K62.0) Impression: Pedunculated prolapsing mass consistent with the concerning anal canal polyp. Should be able to be removed in conjunction with the grade 3 prolapsing internal hemorrhoid  ENCOUNTER FOR PREOPERATIVE EXAMINATION FOR GENERAL SURGICAL PROCEDURE (Z01.818)  Current Plans You are being scheduled for surgery- Our schedulers will call you.  You should hear from our office's scheduling department within 5 working days about the location, date, and time of surgery. We try to make accommodations for patient's preferences in scheduling surgery, but sometimes the OR schedule or the surgeon's schedule prevents [place] from making those accommodations.  If you have not heard from our office [phone]) in 5 working days, call the office and ask for your surgeon's nurse.  If you have other questions about your diagnosis, plan, or surgery, call the office and ask for your surgeon's nurse.  Pt Education - [facility] Rectal Prep for Anorectal outpatient/office surgery: discussed with patient and provided information. Pt Education - [facility] Rectal Surgery HCI (Dhruti Ghuman): discussed with patient and provided information. INCARCERATED UMBILICAL HERNIA ([id number]) Impression: At least partially incarcerated periumbilical ventral hernia.  Small [place] time. At some point would benefit from surgical repair. His and his young age and obesity and activity level, would do mesh underlay repair to minimize recurrence.  Would treat the prolapsing hemorrhoid with anal polyp first  BILATERAL INGUINAL HERNIA WITHOUT OBSTRUCTION OR GANGRENE, RECURRENCE NOT SPECIFIED (K40.20) Impression: Left greater than right small inguinal hernias on Valsalva with  history suspicious as well.  At some point he would benefit from surgical repair since they are symptomatic and he is only in his early 4s. He is interested in getting them fixed perhaps later in the year but once to address the prolapsing anal mass first. Then wait at least a few months. We can discuss that after dealing with the prolapsing anal mass. Would plan laparoscopic TEP Underlay repair and then probably another sheet of mesh periumbilically  Adin Hector, M.D., F.A.C.S. Gastrointestinal and Minimally Invasive Surgery [facility] [address] [place], [place] [id number] [phone] Main / Paging

## 2016-10-22 ENCOUNTER — Encounter (HOSPITAL_BASED_OUTPATIENT_CLINIC_OR_DEPARTMENT_OTHER): Payer: Self-pay | Admitting: *Deleted

## 2016-10-23 ENCOUNTER — Encounter (HOSPITAL_BASED_OUTPATIENT_CLINIC_OR_DEPARTMENT_OTHER): Payer: Self-pay | Admitting: *Deleted

## 2016-10-23 NOTE — Progress Notes (Signed)
NPO AFTER MN.  ARRIVE AT 0700.  NEEDS ISTAT 8 AND EKG.  WILL TAKE LOPRESSOR AM DOS W/ SIPS OF WATER.  PT VERBALIZED UNDERSTANDING RECTAL PREP INSTRUCTIONS FROM OFFICE.

## 2016-10-23 NOTE — Progress Notes (Signed)
   10/23/16 1122  OBSTRUCTIVE SLEEP APNEA  Have you ever been diagnosed with sleep apnea through a sleep study? No  Do you snore loudly (loud enough to be heard through closed doors)?  1  Do you often feel tired, fatigued, or sleepy during the daytime (such as falling asleep during driving or talking to someone)? 0  Has anyone observed you stop breathing during your sleep? 1  Do you have, or are you being treated for high blood pressure? 1  BMI more than 35 kg/m2? 0  Age > 66 (1-yes) 1  Male Gender (Yes=1) 1  Obstructive Sleep Apnea Score 5

## 2016-10-28 ENCOUNTER — Ambulatory Visit (HOSPITAL_BASED_OUTPATIENT_CLINIC_OR_DEPARTMENT_OTHER): Payer: 59 | Admitting: Anesthesiology

## 2016-10-28 ENCOUNTER — Encounter (HOSPITAL_BASED_OUTPATIENT_CLINIC_OR_DEPARTMENT_OTHER)
Admission: RE | Disposition: A | Discharge status: Home / Self Care | Payer: Self-pay | Source: Ambulatory Visit | Attending: Surgery

## 2016-10-28 ENCOUNTER — Ambulatory Visit (HOSPITAL_BASED_OUTPATIENT_CLINIC_OR_DEPARTMENT_OTHER)
Admission: RE | Admit: 2016-10-28 | Discharge: 2016-10-28 | Disposition: A | Discharge status: Home / Self Care | Payer: 59 | Source: Ambulatory Visit | Attending: Surgery | Admitting: Surgery

## 2016-10-28 ENCOUNTER — Encounter (HOSPITAL_BASED_OUTPATIENT_CLINIC_OR_DEPARTMENT_OTHER): Payer: Self-pay | Admitting: *Deleted

## 2016-10-28 DIAGNOSIS — Z6832 Body mass index (BMI) 32.0-32.9, adult: Secondary | ICD-10-CM | POA: Insufficient documentation

## 2016-10-28 DIAGNOSIS — K642 Third degree hemorrhoids: Secondary | ICD-10-CM | POA: Diagnosis present

## 2016-10-28 DIAGNOSIS — I10 Essential (primary) hypertension: Secondary | ICD-10-CM | POA: Diagnosis not present

## 2016-10-28 DIAGNOSIS — K420 Umbilical hernia with obstruction, without gangrene: Secondary | ICD-10-CM

## 2016-10-28 DIAGNOSIS — Z87891 Personal history of nicotine dependence: Secondary | ICD-10-CM | POA: Insufficient documentation

## 2016-10-28 DIAGNOSIS — Z79899 Other long term (current) drug therapy: Secondary | ICD-10-CM | POA: Diagnosis not present

## 2016-10-28 DIAGNOSIS — E669 Obesity, unspecified: Secondary | ICD-10-CM | POA: Insufficient documentation

## 2016-10-28 DIAGNOSIS — Z8719 Personal history of other diseases of the digestive system: Secondary | ICD-10-CM | POA: Insufficient documentation

## 2016-10-28 DIAGNOSIS — D129 Benign neoplasm of anus and anal canal: Secondary | ICD-10-CM | POA: Diagnosis not present

## 2016-10-28 DIAGNOSIS — J449 Chronic obstructive pulmonary disease, unspecified: Secondary | ICD-10-CM | POA: Insufficient documentation

## 2016-10-28 DIAGNOSIS — K4020 Bilateral inguinal hernia, without obstruction or gangrene, not specified as recurrent: Secondary | ICD-10-CM | POA: Diagnosis not present

## 2016-10-28 DIAGNOSIS — R001 Bradycardia, unspecified: Secondary | ICD-10-CM | POA: Diagnosis not present

## 2016-10-28 DIAGNOSIS — K620 Anal polyp: Secondary | ICD-10-CM

## 2016-10-28 HISTORY — DX: Personal history of other diseases of the digestive system: Z87.19

## 2016-10-28 HISTORY — DX: Other specified diseases of anus and rectum: K62.89

## 2016-10-28 HISTORY — DX: Unspecified hemorrhoids: K64.9

## 2016-10-28 HISTORY — PX: EVALUATION UNDER ANESTHESIA WITH HEMORRHOIDECTOMY: SHX5624

## 2016-10-28 HISTORY — DX: Unspecified osteoarthritis, unspecified site: M19.90

## 2016-10-28 HISTORY — DX: Other specified personal risk factors, not elsewhere classified: Z91.89

## 2016-10-28 HISTORY — DX: Anxiety disorder, unspecified: F41.9

## 2016-10-28 HISTORY — PX: MASS EXCISION: SHX2000

## 2016-10-28 HISTORY — DX: Presence of spectacles and contact lenses: Z97.3

## 2016-10-28 LAB — POCT I-STAT, CHEM 8
BUN: 20 mg/dL (ref 6–20)
CHLORIDE: 101 mmol/L (ref 101–111)
Calcium, Ion: 1.18 mmol/L (ref 1.15–1.40)
Creatinine, Ser: 0.8 mg/dL (ref 0.61–1.24)
Glucose, Bld: 125 mg/dL — ABNORMAL HIGH (ref 65–99)
HEMATOCRIT: 45 % (ref 39.0–52.0)
Hemoglobin: 15.3 g/dL (ref 13.0–17.0)
POTASSIUM: 4.3 mmol/L (ref 3.5–5.1)
SODIUM: 137 mmol/L (ref 135–145)
TCO2: 32 mmol/L (ref 0–100)

## 2016-10-28 SURGERY — EXAM UNDER ANESTHESIA WITH HEMORRHOIDECTOMY
Anesthesia: General | Site: Rectum

## 2016-10-28 MED ORDER — LIDOCAINE 2% (20 MG/ML) 5 ML SYRINGE
INTRAMUSCULAR | Status: AC
  Filled 2016-10-28: qty 5

## 2016-10-28 MED ORDER — PROPOFOL 500 MG/50ML IV EMUL
INTRAVENOUS | Status: DC | PRN
Start: 2016-10-28 — End: 2016-10-28
  Administered 2016-10-28: 50 ug/kg/min via INTRAVENOUS

## 2016-10-28 MED ORDER — NAPROXEN 500 MG PO TABS
500.0000 mg | ORAL_TABLET | Freq: Two times a day (BID) | ORAL | Status: DC | PRN
  Filled 2016-10-28: qty 1

## 2016-10-28 MED ORDER — CHLORHEXIDINE GLUCONATE CLOTH 2 % EX PADS
6.0000 | MEDICATED_PAD | Freq: Once | CUTANEOUS | Status: DC
  Filled 2016-10-28: qty 6

## 2016-10-28 MED ORDER — ROCURONIUM BROMIDE 50 MG/5ML IV SOSY
PREFILLED_SYRINGE | INTRAVENOUS | Status: AC
  Filled 2016-10-28: qty 10

## 2016-10-28 MED ORDER — SUCCINYLCHOLINE CHLORIDE 200 MG/10ML IV SOSY
PREFILLED_SYRINGE | INTRAVENOUS | Status: AC
  Filled 2016-10-28: qty 10

## 2016-10-28 MED ORDER — SODIUM CHLORIDE 0.9% FLUSH
3.0000 mL | Freq: Two times a day (BID) | INTRAVENOUS | Status: DC
  Filled 2016-10-28: qty 3

## 2016-10-28 MED ORDER — BUPIVACAINE LIPOSOME 1.3 % IJ SUSP
INTRAMUSCULAR | Status: AC
  Filled 2016-10-28: qty 20

## 2016-10-28 MED ORDER — OXYCODONE HCL 5 MG PO TABS: 5.0000 mg | ORAL_TABLET | ORAL | 0 refills | Status: DC | PRN

## 2016-10-28 MED ORDER — SODIUM CHLORIDE 0.9% FLUSH
3.0000 mL | INTRAVENOUS | Status: DC | PRN
  Filled 2016-10-28: qty 3

## 2016-10-28 MED ORDER — FENTANYL CITRATE (PF) 100 MCG/2ML IJ SOLN
25.0000 ug | INTRAMUSCULAR | Status: DC | PRN
  Filled 2016-10-28: qty 1

## 2016-10-28 MED ORDER — OXYCODONE HCL 5 MG PO TABS
5.0000 mg | ORAL_TABLET | Freq: Once | ORAL | Status: DC | PRN
  Filled 2016-10-28: qty 1

## 2016-10-28 MED ORDER — LIDOCAINE 2% (20 MG/ML) 5 ML SYRINGE
INTRAMUSCULAR | Status: DC | PRN
  Administered 2016-10-28: 70 mg via INTRAVENOUS

## 2016-10-28 MED ORDER — BUPIVACAINE-EPINEPHRINE 0.25% -1:200000 IJ SOLN
INTRAMUSCULAR | Status: DC | PRN
Start: 2016-10-28 — End: 2016-10-28
  Administered 2016-10-28 (×2): 10 mL

## 2016-10-28 MED ORDER — FENTANYL CITRATE (PF) 100 MCG/2ML IJ SOLN
INTRAMUSCULAR | Status: AC
  Filled 2016-10-28: qty 2

## 2016-10-28 MED ORDER — BUPIVACAINE HCL (PF) 0.25 % IJ SOLN
INTRAMUSCULAR | Status: AC
  Filled 2016-10-28: qty 30

## 2016-10-28 MED ORDER — AMBULATORY NON FORMULARY MEDICATION: 1.0000 "application " | Freq: Four times a day (QID) | 2 refills | Status: DC

## 2016-10-28 MED ORDER — EPHEDRINE SULFATE-NACL 50-0.9 MG/10ML-% IV SOSY
PREFILLED_SYRINGE | INTRAVENOUS | Status: DC | PRN
  Administered 2016-10-28 (×2): 10 mg via INTRAVENOUS

## 2016-10-28 MED ORDER — PROPOFOL 10 MG/ML IV BOLUS
INTRAVENOUS | Status: DC | PRN
  Administered 2016-10-28: 200 mg via INTRAVENOUS

## 2016-10-28 MED ORDER — PROPOFOL 500 MG/50ML IV EMUL
INTRAVENOUS | Status: AC
  Filled 2016-10-28: qty 100

## 2016-10-28 MED ORDER — OXYCODONE HCL 5 MG/5ML PO SOLN
5.0000 mg | Freq: Once | ORAL | Status: DC | PRN
  Filled 2016-10-28: qty 5

## 2016-10-28 MED ORDER — BUPIVACAINE LIPOSOME 1.3 % IJ SUSP
20.0000 mL | INTRAMUSCULAR | Status: DC
  Filled 2016-10-28: qty 20

## 2016-10-28 MED ORDER — MIDAZOLAM HCL 5 MG/5ML IJ SOLN
INTRAMUSCULAR | Status: DC | PRN
  Administered 2016-10-28: 2 mg via INTRAVENOUS

## 2016-10-28 MED ORDER — FENTANYL CITRATE (PF) 100 MCG/2ML IJ SOLN
INTRAMUSCULAR | Status: DC | PRN
  Administered 2016-10-28: 25 ug via INTRAVENOUS
  Administered 2016-10-28: 75 ug via INTRAVENOUS

## 2016-10-28 MED ORDER — ONDANSETRON HCL 4 MG/2ML IJ SOLN
INTRAMUSCULAR | Status: AC
  Filled 2016-10-28: qty 2

## 2016-10-28 MED ORDER — METHOCARBAMOL 1000 MG/10ML IJ SOLN
1000.0000 mg | Freq: Four times a day (QID) | INTRAVENOUS | Status: DC | PRN
  Filled 2016-10-28: qty 10

## 2016-10-28 MED ORDER — SODIUM CHLORIDE 0.9 % IV SOLN
250.0000 mL | INTRAVENOUS | Status: DC | PRN
  Filled 2016-10-28: qty 250

## 2016-10-28 MED ORDER — CLINDAMYCIN PHOSPHATE 900 MG/50ML IV SOLN
INTRAVENOUS | Status: AC
  Filled 2016-10-28: qty 50

## 2016-10-28 MED ORDER — PROPOFOL 10 MG/ML IV BOLUS
INTRAVENOUS | Status: AC
  Filled 2016-10-28: qty 20

## 2016-10-28 MED ORDER — ONDANSETRON HCL 4 MG/2ML IJ SOLN
INTRAMUSCULAR | Status: DC | PRN
  Administered 2016-10-28: 4 mg via INTRAVENOUS

## 2016-10-28 MED ORDER — SUCCINYLCHOLINE CHLORIDE 200 MG/10ML IV SOSY
PREFILLED_SYRINGE | INTRAVENOUS | Status: DC | PRN
  Administered 2016-10-28: 100 mg via INTRAVENOUS

## 2016-10-28 MED ORDER — CELECOXIB 400 MG PO CAPS
400.0000 mg | ORAL_CAPSULE | ORAL | Status: AC
  Administered 2016-10-28: 400 mg via ORAL
  Filled 2016-10-28: qty 1

## 2016-10-28 MED ORDER — PROPOFOL 500 MG/50ML IV EMUL
INTRAVENOUS | Status: AC
  Filled 2016-10-28: qty 50

## 2016-10-28 MED ORDER — ONDANSETRON HCL 4 MG/2ML IJ SOLN
4.0000 mg | Freq: Four times a day (QID) | INTRAMUSCULAR | Status: DC | PRN
  Filled 2016-10-28: qty 2

## 2016-10-28 MED ORDER — DEXAMETHASONE SODIUM PHOSPHATE 4 MG/ML IJ SOLN
INTRAMUSCULAR | Status: DC | PRN
  Administered 2016-10-28: 10 mg via INTRAVENOUS

## 2016-10-28 MED ORDER — GENTAMICIN SULFATE 40 MG/ML IJ SOLN
430.0000 mg | INTRAVENOUS | Status: AC
  Administered 2016-10-28: 430 mg via INTRAVENOUS
  Filled 2016-10-28: qty 10.75

## 2016-10-28 MED ORDER — OXYCODONE HCL 5 MG PO TABS
5.0000 mg | ORAL_TABLET | ORAL | Status: DC | PRN
  Filled 2016-10-28: qty 2

## 2016-10-28 MED ORDER — GABAPENTIN 300 MG PO CAPS
ORAL_CAPSULE | ORAL | Status: AC
  Filled 2016-10-28: qty 1

## 2016-10-28 MED ORDER — EPHEDRINE 5 MG/ML INJ
INTRAVENOUS | Status: AC
  Filled 2016-10-28: qty 10

## 2016-10-28 MED ORDER — DEXAMETHASONE SODIUM PHOSPHATE 10 MG/ML IJ SOLN
INTRAMUSCULAR | Status: AC
  Filled 2016-10-28: qty 1

## 2016-10-28 MED ORDER — DIAZEPAM 5 MG PO TABS: 5.0000 mg | ORAL_TABLET | Freq: Four times a day (QID) | ORAL | 1 refills | Status: DC | PRN

## 2016-10-28 MED ORDER — WITCH HAZEL-GLYCERIN EX PADS
1.0000 "application " | MEDICATED_PAD | CUTANEOUS | Status: DC | PRN
  Filled 2016-10-28: qty 100

## 2016-10-28 MED ORDER — ARTIFICIAL TEARS OP OINT
TOPICAL_OINTMENT | OPHTHALMIC | Status: AC
  Filled 2016-10-28: qty 3.5

## 2016-10-28 MED ORDER — HYDROCORTISONE ACE-PRAMOXINE 2.5-1 % RE CREA
1.0000 "application " | TOPICAL_CREAM | Freq: Four times a day (QID) | RECTAL | Status: DC
  Filled 2016-10-28: qty 30

## 2016-10-28 MED ORDER — SUGAMMADEX SODIUM 200 MG/2ML IV SOLN
INTRAVENOUS | Status: AC
  Filled 2016-10-28: qty 2

## 2016-10-28 MED ORDER — NAPROXEN 500 MG PO TABS: 500.0000 mg | ORAL_TABLET | Freq: Two times a day (BID) | ORAL | 1 refills | Status: DC | PRN

## 2016-10-28 MED ORDER — DIAZEPAM 5 MG PO TABS
5.0000 mg | ORAL_TABLET | Freq: Three times a day (TID) | ORAL | Status: DC | PRN
  Filled 2016-10-28: qty 1

## 2016-10-28 MED ORDER — ACETAMINOPHEN 500 MG PO TABS
ORAL_TABLET | ORAL | Status: AC
  Filled 2016-10-28: qty 2

## 2016-10-28 MED ORDER — ROCURONIUM BROMIDE 10 MG/ML (PF) SYRINGE
PREFILLED_SYRINGE | INTRAVENOUS | Status: DC | PRN
  Administered 2016-10-28: 50 mg via INTRAVENOUS

## 2016-10-28 MED ORDER — LACTATED RINGERS IV SOLN
INTRAVENOUS | Status: DC
  Administered 2016-10-28 (×2): via INTRAVENOUS
  Filled 2016-10-28: qty 1000

## 2016-10-28 MED ORDER — CLINDAMYCIN PHOSPHATE 900 MG/50ML IV SOLN
900.0000 mg | INTRAVENOUS | Status: AC
  Administered 2016-10-28: 900 mg via INTRAVENOUS
  Filled 2016-10-28: qty 50

## 2016-10-28 MED ORDER — BUPIVACAINE-EPINEPHRINE (PF) 0.5% -1:200000 IJ SOLN
INTRAMUSCULAR | Status: AC
  Filled 2016-10-28: qty 30

## 2016-10-28 MED ORDER — DIBUCAINE 1 % RE OINT
TOPICAL_OINTMENT | RECTAL | Status: DC | PRN
  Administered 2016-10-28: 1 via RECTAL

## 2016-10-28 MED ORDER — MIDAZOLAM HCL 2 MG/2ML IJ SOLN
INTRAMUSCULAR | Status: AC
  Filled 2016-10-28: qty 2

## 2016-10-28 MED ORDER — CELECOXIB 200 MG PO CAPS
ORAL_CAPSULE | ORAL | Status: AC
  Filled 2016-10-28: qty 2

## 2016-10-28 MED ORDER — BUPIVACAINE LIPOSOME 1.3 % IJ SUSP
INTRAMUSCULAR | Status: DC | PRN
  Administered 2016-10-28 (×2): 10 mL

## 2016-10-28 MED ORDER — ACETAMINOPHEN 500 MG PO TABS
1000.0000 mg | ORAL_TABLET | ORAL | Status: AC
  Administered 2016-10-28: 1000 mg via ORAL
  Filled 2016-10-28: qty 2

## 2016-10-28 MED ORDER — GABAPENTIN 300 MG PO CAPS
300.0000 mg | ORAL_CAPSULE | ORAL | Status: AC
  Administered 2016-10-28: 300 mg via ORAL
  Filled 2016-10-28: qty 1

## 2016-10-28 MED ORDER — BUPIVACAINE-EPINEPHRINE 0.25% -1:200000 IJ SOLN
INTRAMUSCULAR | Status: AC
  Filled 2016-10-28: qty 1

## 2016-10-28 MED ORDER — DIBUCAINE 1 % RE OINT
TOPICAL_OINTMENT | RECTAL | Status: AC
  Filled 2016-10-28: qty 28

## 2016-10-28 SURGICAL SUPPLY — 52 items
BENZOIN TINCTURE PRP APPL 2/3 (GAUZE/BANDAGES/DRESSINGS) ×3 IMPLANT
BLADE HEX COATED 2.75 (ELECTRODE) ×3 IMPLANT
BLADE SURG 10 STRL SS (BLADE) IMPLANT
BLADE SURG 15 STRL LF DISP TIS (BLADE) ×1 IMPLANT
BLADE SURG 15 STRL SS (BLADE) ×2
BRIEF STRETCH FOR OB PAD LRG (UNDERPADS AND DIAPERS) ×3 IMPLANT
CANISTER SUCTION 1200CC (MISCELLANEOUS) ×3 IMPLANT
COVER BACK TABLE 60X90IN (DRAPES) ×3 IMPLANT
COVER MAYO STAND STRL (DRAPES) ×3 IMPLANT
DECANTER SPIKE VIAL GLASS SM (MISCELLANEOUS) IMPLANT
DRAPE LAPAROTOMY 100X72 PEDS (DRAPES) ×3 IMPLANT
DRAPE LG THREE QUARTER DISP (DRAPES) IMPLANT
ELECT NEEDLE TIP 2.8 STRL (NEEDLE) IMPLANT
ELECT REM PT RETURN 9FT ADLT (ELECTROSURGICAL) ×3
ELECTRODE REM PT RTRN 9FT ADLT (ELECTROSURGICAL) ×1 IMPLANT
GLOVE BIOGEL PI IND STRL 8 (GLOVE) ×1 IMPLANT
GLOVE BIOGEL PI INDICATOR 8 (GLOVE) ×2
GLOVE ECLIPSE 8.0 STRL XLNG CF (GLOVE) ×3 IMPLANT
GOWN STRL REUS W/ TWL LRG LVL3 (GOWN DISPOSABLE) ×1 IMPLANT
GOWN STRL REUS W/ TWL XL LVL3 (GOWN DISPOSABLE) ×1 IMPLANT
GOWN STRL REUS W/TWL LRG LVL3 (GOWN DISPOSABLE) ×2
GOWN STRL REUS W/TWL XL LVL3 (GOWN DISPOSABLE) ×2
KIT RM TURNOVER CYSTO AR (KITS) ×3 IMPLANT
LEGGING LITHOTOMY PAIR STRL (DRAPES) IMPLANT
NEEDLE HYPO 22GX1.5 SAFETY (NEEDLE) ×3 IMPLANT
NS IRRIG 500ML POUR BTL (IV SOLUTION) ×3 IMPLANT
PACK BASIN DAY SURGERY FS (CUSTOM PROCEDURE TRAY) ×3 IMPLANT
PAD ABD 8X10 STRL (GAUZE/BANDAGES/DRESSINGS) ×3 IMPLANT
PENCIL BUTTON HOLSTER BLD 10FT (ELECTRODE) ×3 IMPLANT
SCRUB TECHNI CARE 4 OZ NO DYE (MISCELLANEOUS) ×3 IMPLANT
SHEARS HARMONIC 9CM CVD (BLADE) IMPLANT
SPONGE SURGIFOAM ABS GEL 100 (HEMOSTASIS) IMPLANT
SPONGE SURGIFOAM ABS GEL 12-7 (HEMOSTASIS) IMPLANT
SURGILUBE 2OZ TUBE FLIPTOP (MISCELLANEOUS) ×3 IMPLANT
SUT CHROMIC 2 0 SH (SUTURE) IMPLANT
SUT CHROMIC 3 0 SH 27 (SUTURE) IMPLANT
SUT VIC AB 2-0 SH 27 (SUTURE) ×2
SUT VIC AB 2-0 SH 27XBRD (SUTURE) ×1 IMPLANT
SUT VIC AB 2-0 UR6 27 (SUTURE) IMPLANT
SUT VICRYL 0 UR6 27IN ABS (SUTURE) ×3 IMPLANT
SUT VICRYL AB 2 0 TIE (SUTURE) IMPLANT
SUT VICRYL AB 2 0 TIES (SUTURE)
SYR 20CC LL (SYRINGE) ×3 IMPLANT
SYR BULB IRRIGATION 50ML (SYRINGE) ×3 IMPLANT
SYR CONTROL 10ML LL (SYRINGE) ×3 IMPLANT
TAPE CLOTH 3X10 TAN LF (GAUZE/BANDAGES/DRESSINGS) ×3 IMPLANT
TOWEL OR 17X24 6PK STRL BLUE (TOWEL DISPOSABLE) ×6 IMPLANT
TRAY DSU PREP LF (CUSTOM PROCEDURE TRAY) ×3 IMPLANT
TUBE CONNECTING 12'X1/4 (SUCTIONS) ×1
TUBE CONNECTING 12X1/4 (SUCTIONS) ×2 IMPLANT
UNDERPAD 30X30 INCONTINENT (UNDERPADS AND DIAPERS) ×3 IMPLANT
YANKAUER SUCT BULB TIP NO VENT (SUCTIONS) ×3 IMPLANT

## 2016-10-28 NOTE — Interval H&P Note (Signed)
History and Physical Interval Note:  10/28/2016 8:38 AM  Cameron Kemp  has presented today for surgery, with the diagnosis of Prolapsing anal mass  Symptomatic hemorrhoids  The various methods of treatment have been discussed with the patient and family. After consideration of risks, benefits and other options for treatment, the patient has consented to  Procedure(s): EXAM UNDER ANESTHESIA WITH POSSIBLE HEMORRHOIDECTOMY HEMORRHOIDAL LIGATION PEXY (N/A) EXCISION ANAL MASS (N/A) as a surgical intervention .  The patient's history has been reviewed, patient examined, no change in status, stable for surgery.  I have reviewed the patient's chart and labs.  Questions were answered to the patient's satisfaction.     Ziere Docken C.

## 2016-10-28 NOTE — Anesthesia Postprocedure Evaluation (Signed)
Anesthesia Post Note  Patient: Cameron Kemp  Procedure(s) Performed: Procedure(s) (LRB): EXAM UNDER ANESTHESIA WITH HEMORRHOIDECTOMY HEMORRHOIDAL LIGATION PEXY (N/A) EXCISION ANAL CANAL MASS (N/A)  Patient location during evaluation: PACU Anesthesia Type: General Level of consciousness: awake and alert and patient cooperative Pain management: pain level controlled Vital Signs Assessment: post-procedure vital signs reviewed and stable Respiratory status: spontaneous breathing and respiratory function stable Cardiovascular status: stable Anesthetic complications: no       Last Vitals:  Vitals:   10/28/16 1045 10/28/16 1100  BP: 107/72 113/73  Pulse: (!) 56 (!) 57  Resp: 15 13  Temp:      Last Pain:  Vitals:   10/28/16 0600  TempSrc: Oral                 [name] S

## 2016-10-28 NOTE — H&P (Addendum)
10/28/2016  Cameron Kemp 09/07/2016 4:32 PM Location: [facility] Patient #: [id number] DOB: 1961-02-03 Married / Language: Cleophus Molt / Race: White Male  Patient Care Team: Jani Gravel, MD as PCP - General (Internal Medicine)   History of Present Illness  Patient sent for surgical consultation at the request of Dr. Carol Ada with gastroenterology. Concern for distal rectal polyp.  Pleasant 56 year old male one. History of intermittent rectal bleeding. Underwent colonoscopy with numerous polypectomies done. Found to have a mass in his distal rectum. Also symptomatic hemorrhoids. Surgical consultation recommended for removal. Rest of the polyps patient recalls being told were adenomatous and benign. Patient notes his rectal bleeding has gone down since the polypectomies. However he has known as the lump popping out of the anus. He recalls something popping out intermittently for the past decade. He usually moves his bowels once or twice a day. He has never had any treatments for hemorrhoids. No banding or injection. Tries to do occasional wet wipes. He had some mild dyspnea on exertion. Negative pulmonary and cardiac workup. Quit smoking a year ago. Hypertension controlled.  Basic concerns of a prolapsing anal mass that was too sensitive to remove by colonoscopy, surgical consultation requested for removal. No personal nor family history of GI/colon cancer, inflammatory bowel disease, irritable bowel syndrome, allergy such as Celiac Sprue, dietary/dairy problems, colitis, ulcers nor gastritis. No recent sick contacts/gastroenteritis. No travel outside the country. No changes in diet. No dysphagia to solids or liquids. No significant heartburn or reflux. No hematochezia, hematemesis, coffee ground emesis. No evidence of prior gastric/peptic ulceration.  Patient also recalls being told he had a hernia in his left groin. He is noticed a hernia at his bellybutton  as well. He has had that for a while. He thinks the one at the bellybutton has become slightly larger. Gets some intermittent sharp pains in his groins but nothing too severe. That has been going on for the past couple years. He wonders if at some point he should get hernias repaired as well. It is not a priority compared to the prolapsing anal mass   Past Surgical History (April Staton, [place]; 09/07/2016 4:32 PM) Colon Polyp Removal - Colonoscopy  Oral Surgery   Diagnostic Studies History (April Staton, [place]; 09/07/2016 4:32 PM) Colonoscopy  within last year  Allergies (April Staton, CMA; 09/07/2016 4:33 PM) Penicillins   Medication History (April Staton, CMA; 09/07/2016 4:34 PM) Metoprolol Tartrate (25MG  Tablet, Oral) Active. HydroCHLOROthiazide (25MG  Tablet, Oral) Active. LORazepam (0.5MG  Tablet, Oral) Active. Medications Reconciled  Social History (April Staton, CMA; 09/07/2016 4:32 PM) Alcohol use  Moderate alcohol use. Caffeine use  Coffee. Illicit drug use  Remotely quit drug use. Tobacco use  Former smoker.  Family History (April Staton, [place]; 09/07/2016 4:32 PM) Alcohol Abuse  Father. Arthritis  Brother, Mother. Colon Polyps  Brother, Mother. Hypertension  Brother, Mother.  Other Problems (April Staton, CMA; 09/07/2016 4:32 PM) Anxiety Disorder  Arthritis  Back Pain  Gastroesophageal Reflux Disease  Hemorrhoids  High blood pressure  Inguinal Hernia  Umbilical Hernia Repair     Review of Systems (April Staton CMA; 09/07/2016 4:32 PM) General Present- Night Sweats and Weight Gain. Not Present- Appetite Loss, Chills, Fatigue, Fever and Weight Loss. Skin Present- Change in Wart/Mole and Dryness. Not Present- Hives, Jaundice, New Lesions, Non-Healing Wounds, Rash and Ulcer. HEENT Present- Ringing in the Ears, Seasonal Allergies, Sinus Pain and Wears glasses/contact lenses. Not Present- Earache, Hearing Loss, Hoarseness, Nose Bleed, Oral Ulcers, Sore  Throat, Visual Disturbances  and Yellow Eyes. Respiratory Present- Snoring. Not Present- Bloody sputum, Chronic Cough, Difficulty Breathing and Wheezing. Breast Not Present- Breast Mass, Breast Pain, Nipple Discharge and Skin Changes. Cardiovascular Not Present- Chest Pain, Difficulty Breathing Lying Down, Leg Cramps, Palpitations, Rapid Heart Rate, Shortness of Breath and Swelling of Extremities. Gastrointestinal Present- Bloody Stool and Hemorrhoids. Not Present- Abdominal Pain, Bloating, Change in Bowel Habits, Chronic diarrhea, Constipation, Difficulty Swallowing, Excessive gas, Gets full quickly at meals, Indigestion, Nausea, Rectal Pain and Vomiting. Male Genitourinary Present- Nocturia. Not Present- Blood in Urine, Change in Urinary Stream, Frequency, Impotence, Painful Urination, Urgency and Urine Leakage. Musculoskeletal Present- Back Pain, Joint Pain, Joint Stiffness and Muscle Pain. Not Present- Muscle Weakness and Swelling of Extremities. Neurological Present- Tingling. Not Present- Decreased Memory, Fainting, Headaches, Numbness, Seizures, Tremor, Trouble walking and Weakness. Psychiatric Present- Anxiety. Not Present- Bipolar, Change in Sleep Pattern, Depression, Fearful and Frequent crying. Endocrine Not Present- Cold Intolerance, Excessive Hunger, Hair Changes, Heat Intolerance, Hot flashes and New Diabetes. Hematology Not Present- Blood Thinners, Easy Bruising, Excessive bleeding, Gland problems, HIV and Persistent Infections.  Vitals (April Staton CMA; 09/07/2016 4:34 PM) 09/07/2016 4:34 PM Weight: 233.25 lb Height: 70.5in Body Surface Area: 2.24 m Body Mass Index: 32.99 kg/m  Temp.: 98.1F(Oral)  Pulse: 63 (Regular)  BP: 122/82 (Sitting, Left Arm, Standard)   BP 137/76 (BP Location: Left Arm, Patient Position: Sitting)   Pulse 60   Temp 98.2 F (36.8 C) (Oral)   Resp 18   Ht 5' 10.5" (1.791 m)   Wt 103.2 kg (227 lb 8 oz)   SpO2 97%   BMI 32.18 kg/m       Physical Exam Adin Hector MD; 09/08/2016 8:03 AM) General Mental Status-Alert. General Appearance-Not in acute distress, Not Sickly. Orientation-Oriented X3. Hydration-Well hydrated. Voice-Normal.  Integumentary Global Assessment Upon inspection and palpation of skin surfaces of the - Axillae: non-tender, no inflammation or ulceration, no drainage. and Distribution of scalp and body hair is normal. General Characteristics Temperature - normal warmth is noted.  Head and Neck Head-normocephalic, atraumatic with no lesions or palpable masses. Face Global Assessment - atraumatic, no absence of expression. Neck Global Assessment - no abnormal movements, no bruit auscultated on the right, no bruit auscultated on the left, no decreased range of motion, non-tender. Trachea-midline. Thyroid Gland Characteristics - non-tender.  Eye Eyeball - Left-Extraocular movements intact, No Nystagmus. Eyeball - Right-Extraocular movements intact, No Nystagmus. Cornea - Left-No Hazy. Cornea - Right-No Hazy. Sclera/Conjunctiva - Left-No scleral icterus, No Discharge. Sclera/Conjunctiva - Right-No scleral icterus, No Discharge. Pupil - Left-Direct reaction to light normal. Pupil - Right-Direct reaction to light normal.  ENMT Ears Pinna - Left - no drainage observed, no generalized tenderness observed. Right - no drainage observed, no generalized tenderness observed. Nose and Sinuses External Inspection of the Nose - no destructive lesion observed. Inspection of the nares - Left - quiet respiration. Right - quiet respiration. Mouth and Throat Lips - Upper Lip - no fissures observed, no pallor noted. Lower Lip - no fissures observed, no pallor noted. Nasopharynx - no discharge present. Oral Cavity/Oropharynx - Tongue - no dryness observed. Oral Mucosa - no cyanosis observed. Hypopharynx - no evidence of airway distress observed.  Chest and Lung  Exam Inspection Movements - Normal and Symmetrical. Accessory muscles - No use of accessory muscles in breathing. Palpation Palpation of the chest reveals - Non-tender. Auscultation Breath sounds - Normal and Clear.  Cardiovascular Auscultation Rhythm - Regular. Murmurs & Other Heart Sounds - Auscultation of  the heart reveals - No Murmurs and No Systolic Clicks.  Abdomen Inspection Inspection of the abdomen reveals - No Visible peristalsis and No Abnormal pulsations. Umbilicus - No Bleeding, No Urine drainage. Palpation/Percussion Palpation and Percussion of the abdomen reveal - Soft, Non Tender, No Rebound tenderness, No Rigidity (guarding) and No Cutaneous hyperesthesia. Note: Abdomen soft. Nontender, nondistended. No guarding.   Mild diastasis. 2cm periumbilical mass partially reducible consistent with at least umbilical and possible small Swiss cheese ventral type hernia   Male Genitourinary Sexual Maturity Tanner 5 - Adult hair pattern and Adult penile size and shape. Note: Subtle impulse left greater than right suspicious for bilateral inguinal hernias.   Normal external genitalia. Epididymi, testes, and spermatic cords normal without any masses.   Rectal Note: Bulky right posterior internal hemorrhoid grade 3 with pedunculated smooth polyp on it that prolapses easily. Grade 2 right anterior and left lateral hemorrhoids as well. No other rectal masses.  Perianal skin clean with good hygiene. No pruritis ani. No pilonidal disease. No fissure. No abscess/fistula. Normal sphincter tone.  No external hemorrhoids. No condyloma warts. Tolerates digital and anoscopic rectal exam.   Peripheral Vascular Upper Extremity Inspection - Left - No Cyanotic nailbeds, Not Ischemic. Right - No Cyanotic nailbeds, Not Ischemic.  Neurologic Neurologic evaluation reveals -normal attention span and ability to concentrate, able to name objects and repeat phrases.  Appropriate fund of knowledge , normal sensation and normal coordination. Mental Status Affect - not angry, not paranoid. Cranial Nerves-Normal Bilaterally. Gait-Normal.  Neuropsychiatric Mental status exam performed with findings of-able to articulate well with normal speech/language, rate, volume and coherence, thought content normal with ability to perform basic computations and apply abstract reasoning and no evidence of hallucinations, delusions, obsessions or homicidal/suicidal ideation.  Musculoskeletal Global Assessment Spine, Ribs and Pelvis - no instability, subluxation or laxity. Right Upper Extremity - no instability, subluxation or laxity.  Lymphatic Head & Neck  General Head & Neck Lymphatics: Bilateral - Description - No Localized lymphadenopathy. Axillary  General Axillary Region: Bilateral - Description - No Localized lymphadenopathy. Femoral & Inguinal  Generalized Femoral & Inguinal Lymphatics: Left - Description - No Localized lymphadenopathy. Right - Description - No Localized lymphadenopathy.   Results Adin Hector MD; 09/08/2016 8:06 AM) Procedures  Name Value Date Hemorrhoids Procedure Anal exam: External Hemorrhoid Internal exam: Internal Hemorroids ( non-bleeding) prolapse Mass Other: Right posterior grade 3 prolapsing internal hemorrhoid with pedunculated mass on it. Most likely a fibroepithelial polyp could be adenomatous. Right anterior and left lateral grade 2 internal hemorrhoids friable  Performed: 09/07/2016 4:55 PM    Assessment & Plan  PROLAPSED INTERNAL HEMORRHOIDS, GRADE 3 (K64.2) Impression: Rather large right posterior internal hemorrhoid prolapsing with pedunculated mass on it. I think this will require removal. Can most likely due to hemorrhoidal ligation and pexy on the other two hemorrhoids. Should be outpatient surgery. He is interested in proceeding.  Current Plans Pt Education - [facility] Hemorrhoids  (Jeremian Whitby): discussed with patient and provided information. Pt Education - Pamphlet Given - The Hemorrhoid Book: discussed with patient and provided information. You are being scheduled for surgery- Our schedulers will call you.  You should hear from our office's scheduling department within 5 working days about the location, date, and time of surgery. We try to make accommodations for patient's preferences in scheduling surgery, but sometimes the OR schedule or the surgeon's schedule prevents [place] from making those accommodations.  If you have not heard from our office [phone]) in 5 working days,  call the office and ask for your surgeon's nurse.  If you have other questions about your diagnosis, plan, or surgery, call the office and ask for your surgeon's nurse.  The anatomy & physiology of the anorectal region was discussed. The pathophysiology of hemorrhoids and differential diagnosis was discussed. Natural history risks without surgery was discussed. I stressed the importance of a bowel regimen to have daily soft bowel movements to minimize progression of disease. Interventions such as sclerotherapy & banding were discussed.  The patient's symptoms are not adequately controlled by medicines and other non-operative treatments. I feel the risks & problems of no surgery outweigh the operative risks; therefore, I recommended surgery to treat the hemorrhoids by ligation, pexy, and possible resection.  Risks such as bleeding, infection, urinary difficulties, need for further treatment, heart attack, death, and other risks were discussed. I noted a good likelihood this will help address the problem. Goals of post-operative recovery were discussed as well. Possibility that this will not correct all symptoms was explained. Post-operative pain, bleeding, constipation, and other problems after surgery were discussed. We will work to minimize complications. Educational handouts further  explaining the pathology, treatment options, and bowel regimen were given as well. Questions were answered. The patient expresses understanding & wishes to proceed with surgery.  ANOSCOPY, DIAGNOSTIC (46600) PROLAPSED INTERNAL HEMORRHOIDS, GRADE 2 (K64.1) Impression: Right anterior left lateral piles. Hopefully just hemorrhoidal ligation and pexy is adequate for them. ANAL POLYP (K62.0) Impression: Pedunculated prolapsing mass consistent with the concerning anal canal polyp. Should be able to be removed in conjunction with the grade 3 prolapsing internal hemorrhoid ENCOUNTER FOR PREOPERATIVE EXAMINATION FOR GENERAL SURGICAL PROCEDURE (Z01.818) Current Plans You are being scheduled for surgery- Our schedulers will call you.  You should hear from our office's scheduling department within 5 working days about the location, date, and time of surgery. We try to make accommodations for patient's preferences in scheduling surgery, but sometimes the OR schedule or the surgeon's schedule prevents [place] from making those accommodations.  If you have not heard from our office [phone]) in 5 working days, call the office and ask for your surgeon's nurse.  If you have other questions about your diagnosis, plan, or surgery, call the office and ask for your surgeon's nurse.  Pt Education - [facility] Rectal Prep for Anorectal outpatient/office surgery: discussed with patient and provided information. Pt Education - [facility] Rectal Surgery HCI (Darrol Brandenburg): discussed with patient and provided information. INCARCERATED UMBILICAL HERNIA (P71.0) Impression: At least partially incarcerated periumbilical ventral hernia.  Small [place] time. At some point would benefit from surgical repair. His and his young age and obesity and activity level, would do mesh underlay repair to minimize recurrence.  Would treat the prolapsing hemorrhoid with anal polyp first BILATERAL INGUINAL HERNIA WITHOUT OBSTRUCTION OR GANGRENE,  RECURRENCE NOT SPECIFIED (K40.20) Impression: Left greater than right small inguinal hernias on Valsalva with history suspicious as well.  At some point he would benefit from surgical repair since they are symptomatic and he is only in his early 2s. He is interested in getting them fixed perhaps later in the year but once to address the prolapsing anal mass first. Then wait at least a few months. We can discuss that after dealing with the prolapsing anal mass. Would plan laparoscopic TEP Underlay repair and then probably another sheet of mesh periumbilically   Adin Hector, M.D., F.A.C.S. Gastrointestinal and Minimally Invasive Surgery [facility] [address], [place], [place] [address] [phone]  [phone] Main / Paging  I have re-reviewed the the patient's records, history, medications, and allergies.  I have re-examined the patient.  I again discussed intraoperative plans and goals of post-operative recovery.  The patient agrees to proceed.  Cameron Kemp  [birth year] [id number]  Patient Care Team: Jani Gravel, MD as PCP - General (Internal Medicine)  There are no active problems to display for this patient.   Past Medical History:  Diagnosis Date  . Anxiety   . Arthritis    hands, wrist, elbows  . At risk for sleep apnea    STOP-BANG= 5           SENT TO PCP 10-23-2016  . COPD (chronic obstructive pulmonary disease) with emphysema ([place])   . Hemorrhoids    symptomatic  . History of gastroesophageal reflux (GERD)   . Hypertension   . Mass of perianal area    prolapsed  . Wears glasses     Past Surgical History:  Procedure Laterality Date  . COLONOSCOPY  08/2016  . TONSILLECTOMY  age 23    Social History   Social History  . Marital status: Single    Spouse name: N/A  . Number of children: N/A  . Years of education: N/A   Occupational History  . Not on file.   Social History Main Topics  . Smoking status: Former Smoker     Packs/day: 1.00    Years: 25.00    Types: Cigarettes    Quit date: 07/25/2014  . Smokeless tobacco: Never Used  . Alcohol use 6.0 oz/week    10 Cans of beer per week  . Drug use: No  . Sexual activity: Not on file   Other Topics Concern  . Not on file   Social History Narrative  . No narrative on file    Family History  Problem Relation Age of Onset  . Hyperlipidemia Brother   . Hypertension Brother   . Stroke Paternal Grandmother   . Hypertension Brother     Current Facility-Administered Medications  Medication Dose Route Frequency Provider Last Rate Last Dose  . acetaminophen (TYLENOL) tablet 1,000 mg  1,000 mg Oral On Call to OR Michael Boston, MD      . bupivacaine liposome (EXPAREL) 1.3 % injection 266 mg  20 mL Infiltration On Call to OR Michael Boston, MD      . celecoxib (CELEBREX) 400 MG capsule 400 mg  400 mg Oral On Call to OR Michael Boston, MD      . Chlorhexidine Gluconate Cloth 2 % PADS 6 each  6 each Topical Once Michael Boston, MD       And  . Chlorhexidine Gluconate Cloth 2 % PADS 6 each  6 each Topical Once Michael Boston, MD      . clindamycin (CLEOCIN) IVPB 900 mg  900 mg Intravenous On Call to OR Michael Boston, MD       And  . gentamicin (GARAMYCIN) 5 mg/kg in dextrose 5 % 100 mL IVPB  5 mg/kg Intravenous On Call to OR Michael Boston, MD      . gabapentin (NEURONTIN) capsule 300 mg  300 mg Oral On Call to [place], MD      . lactated ringers infusion   Intravenous Continuous Albertha Ghee, MD         Allergies  Allergen Reactions  . Penicillins Rash    Has patient had a PCN reaction causing immediate rash, facial/tongue/throat swelling, SOB or lightheadedness with hypotension:  YES Has patient had a PCN reaction causing severe rash involving mucus membranes or skin necrosis: NO Has patient had a PCN reaction that required hospitalization NO Has patient had a PCN reaction occurring within the last 10 years: No If all of the above answers are "NO", then may  proceed with Cephalosporin use.    BP 137/76 (BP Location: Left Arm, Patient Position: Sitting)   Pulse 60   Temp 98.2 F (36.8 C) (Oral)   Resp 18   Ht 5' 10.5" (1.791 m)   Wt 103.2 kg (227 lb 8 oz)   SpO2 97%   BMI 32.18 kg/m   Labs: No results found for this or any previous visit (from the past 48 hour(s)).  Imaging / Studies: No results found.   Adin Hector, M.D., F.A.C.S. Gastrointestinal and Minimally Invasive Surgery [facility] [address], [place],  [address] [phone] Main / Paging  10/28/2016 7:01 AM

## 2016-10-28 NOTE — Anesthesia Preprocedure Evaluation (Addendum)
Anesthesia Evaluation  Patient identified by MRN, date of birth, ID band Patient awake    Reviewed: Allergy & Precautions, H&P , NPO status , Patient's Chart, lab work & pertinent test results  Airway Mallampati: III  TM Distance: >3 FB Neck ROM: Full    Dental  (+) Teeth Intact, Dental Advisory Given   Pulmonary COPD, former smoker,    Pulmonary exam normal        Cardiovascular hypertension, Pt. on medications and Pt. on home beta blockers  Rhythm:Regular Rate:Normal  28-Oct-2016  Sinus bradycardia Otherwise normal ECG   Neuro/Psych PSYCHIATRIC DISORDERS Anxiety    GI/Hepatic GERD  ,  Endo/Other  obese  Renal/GU      Musculoskeletal  (+) Arthritis , Osteoarthritis,    Abdominal   Peds  Hematology   Anesthesia Other Findings   Reproductive/Obstetrics                           Anesthesia Physical Anesthesia Plan  ASA: II  Anesthesia Plan: General   Post-op Pain Management:    Induction: Intravenous  Airway Management Planned: Oral ETT  Additional Equipment:   Intra-op Plan:   Post-operative Plan: Extubation in OR  Informed Consent: I have reviewed the patients History and Physical, chart, labs and discussed the procedure including the risks, benefits and alternatives for the proposed anesthesia with the patient or authorized representative who has indicated his/her understanding and acceptance.     Plan Discussed with: CRNA, Anesthesiologist and Surgeon  Anesthesia Plan Comments:        Anesthesia Quick Evaluation

## 2016-10-28 NOTE — Op Note (Signed)
10/28/2016  9:44 AM  PATIENT:  Cameron Kemp  56 y.o. 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 Liposomal bupivacaine (Experel) at the end of the case.  EBL:  Total I/O In: 850 [I.V.:850] Out: 20 [Blood:20].  See operative record  Delay start of Pharmacological VTE agent (>24hrs) due to surgical blood loss or risk of bleeding:  NO  DRAINS: NONE  SPECIMEN:    1.  Posterior midline pedunculated prolapsing adenomatous polyp.   2.  Anal canal fibroepithelial polyps x2.   3.  Right posterior internal/external hemorrhoid. 4.  Left anterior external hemorrhoid  DISPOSITION OF SPECIMEN:  PATHOLOGY  COUNTS:  YES  PLAN OF CARE: Discharge home after PACU  PATIENT DISPOSITION:  PACU - hemodynamically stable.  INDICATION: Pleasant patient with struggles with hemorrhoids.  Found to have prolapsing anal canal masses suspicious for a polyp.  Also with grade 2-3 internal hemorrhoids.   Not able to be managed in the office despite an improved bowel regimen.  I recommended examination under anesthesia and surgical treatment:  The anatomy & physiology of the anorectal region was discussed.  The pathophysiology of hemorrhoids and differential diagnosis was discussed.  Natural history  risks without surgery was discussed.   I stressed the importance of a bowel regimen to have daily soft bowel movements to minimize progression of disease.  Interventions such as sclerotherapy & banding were discussed.  The patient's symptoms are not adequately controlled by medicines and other non-operative treatments.  I feel the risks & problems of no surgery outweigh the operative risks; therefore, I recommended surgery to treat the hemorrhoids by ligation, pexy, and possible resection.  Risks such as bleeding, infection, need for further treatment, heart attack, death, and other risks were discussed.   I noted a good likelihood this will help address the problem.  Goals of post-operative recovery were discussed as well.  Possibility that this will not correct all symptoms was explained.  Post-operative pain, bleeding, constipation, urinary difficulties, and other problems after surgery were discussed.  We will work to minimize complications.   Educational handouts further explaining the pathology, treatment options, and bowel regimen were given as well.  Questions were answered.  The patient expresses understanding & wishes to proceed with surgery.  OR FINDINGS: Pedunculated posterior midline polyp at the anorectal junction.  Excised.  Posterior anal canal smooth fibrous polyps most likely consistent with enlarged hypertrophic anal crypt polyps with some prolapsing.  Excised.  Persistent right posterior internal/external hemorrhoid.  Excised..  Small but irritated left anterior external hemorrhoid excised.  Probable old posterior midline fissure with flat scar.  Closed.  Normal sphincter tone.  No condyloma.  No abscess.  No fistula.  No proctitis.  No rectal prolapse / procidentia.  DESCRIPTION:   Informed consent was confirmed. Patient underwent general anesthesia without difficulty. Patient was placed into prone positioning.  The perianal region was prepped and draped in sterile fashion. Surgical  time-out confirmed our plan.  I did digital rectal examination and then transitioned over to anoscopy to get a sense of the anatomy.  Findings noted above.   I excised the prolapsing adenomatous posterior anal canal polyp.  Almost end up excising the prolapsing anal canal fibroepithelial polyps as well.  One larger resection and one smaller resection done along the posterior circumference, primarily resulting in a longitudinal wound along the right posterior anal canal .  I used a 2-0 Vicryl suture on a UR-6 needle in a figure-of-eight fashion 6 cm proximal to the anal verge. I excised the prolapsing polyps and hemorrhoid involving the posterior circumference prior merely focused on the right posterior midline region in a fusiform biconcave fashion, sparing rectal wall & anoderm to avoid narrowing.  I then ran that stitch longitudinally more distally to close the hemorrhoidectomy wound to the anal verge over a large Hill-Furgeson retarctor to avoid narrowing of the anal canal.  I then tied that stitch down to cause a hemorrhoidopexy.   I did not and excising some posterior midline scar consistent with a prior anal fissure.  Got healthy or anoderm.  I closed the external wounds transversely with 2-0 chromic suture, leaving the last 5 mm right posterior lateral aspect  open to allow natural drainage.    Anteriorly I did do some hemorrhoidal ligations with 2-0 Vicryl suture with pexy on the right anterior and left anterior piles.  Ended up excising a persistent left anterior external hemorrhoidal pile nearly closed that with 2-0 chromic interrupted horizontal mattress suture.      I redid anoscopy examination.   At completion of this, all polyps and grade 3 hemorrhoid had been removed.  The grade 2 hemorrhoids were markedly reduced down to more normal caliber.  There is no prolapse.  External anatomy looked much more normal.  Hemostasis was good.  Patient is being extubated go to go to the recovery room.  I  had discussed postop care in detail with the patient in the preop holding area.  Instructions for post-operative recovery and prescriptions are written. I discussed operative findings, updated the patient's status, discussed probable steps to recovery, and gave postoperative recommendations to the patient's family.  Recommendations were made.  Questions were answered.  They expressed understanding & appreciation.  Adin Hector, M.D., F.A.C.S. Gastrointestinal and Minimally Invasive Surgery [facility] [address], [place], [place] [address] [phone] [address] / Paging

## 2016-10-28 NOTE — Anesthesia Procedure Notes (Signed)
Procedure Name: Intubation Date/Time: 10/28/2016 8:48 AM Performed by: Wanita Chamberlain Pre-anesthesia Checklist: Patient identified, Timeout performed, Emergency Drugs available, Suction available and Patient being monitored Patient Re-evaluated:Patient Re-evaluated prior to inductionOxygen Delivery Method: Circle system utilized Preoxygenation: Pre-oxygenation with 100% oxygen Intubation Type: IV induction Ventilation: Oral airway inserted - appropriate to patient size and Mask ventilation without difficulty Laryngoscope Size: Mac and 4 Grade View: Grade III Tube type: Oral (cricoid manipulation/pressure) Tube size: 8.0 mm Number of attempts: 1 Airway Equipment and Method: Stylet Placement Confirmation: ETT inserted through vocal cords under direct vision,  positive ETCO2 and breath sounds checked- equal and bilateral Secured at: 22 cm Tube secured with: Tape Dental Injury: Teeth and Oropharynx as per pre-operative assessment  Difficulty Due To: Difficulty was anticipated and Difficult Airway- due to reduced neck mobility Future Recommendations: Recommend- induction with short-acting agent, and alternative techniques readily available

## 2016-10-28 NOTE — Discharge Instructions (Signed)
ANORECTAL SURGERY:  °POST OPERATIVE INSTRUCTIONS ° °###################################################################### ° °EAT °Gradually transition to a high fiber diet with a fiber supplement over the next few weeks after discharge.  Start with a pureed / full liquid diet (see below) ° °WALK °Walk an hour a day.  Control your pain to do that.   ° °CONTROL PAIN °Control pain so that you can walk, sleep, tolerate sneezing/coughing, go up/down stairs. ° °HAVE A BOWEL MOVEMENT DAILY °Keep your bowels regular to avoid problems.  OK to try a laxative to override constipation.  OK to use an antidairrheal to slow down diarrhea.  Call if not better after 2 tries ° °CALL IF YOU HAVE PROBLEMS/CONCERNS °Call if you are still struggling despite following these instructions. °Call if you have concerns not answered by these instructions ° °###################################################################### ° ° ° °1. Take your usually prescribed home medications unless otherwise directed. °2. DIET: Follow a light bland diet the first 24 hours after arrival home, such as soup, liquids, crackers, etc.  Be sure to include lots of fluids daily.  Avoid fast food or heavy meals as your are more likely to get nauseated.  Eat a low fat the next few days after surgery.   °3. PAIN CONTROL: °a. Pain is best controlled by a usual combination of three different methods TOGETHER: °i. Ice/Heat °ii. Over the counter pain medication °iii. Prescription pain medication °b. Most patients will experience some swelling and discomfort in the anus/rectal area. and incisions.  Ice packs or heat (30-60 minutes up to 6 times a day) will help. Use ice for the first few days to help decrease swelling and bruising, then switch to heat such as warm towels, sitz baths, warm baths, etc to help relax tight/sore spots and speed recovery.  Some people prefer to use ice alone, heat alone, alternating between ice & heat.  Experiment to what works for you.   Swelling and bruising can take several weeks to resolve.   °c. It is helpful to take an over-the-counter pain medication regularly for the first few weeks.  Choose one of the following that works best for you: °i. Naproxen (Aleve, etc)  Two 220mg tabs twice a day °ii. Ibuprofen (Advil, etc) Three 200mg tabs four times a day (every meal & bedtime) °iii. Acetaminophen (Tylenol, etc) 500-650mg four times a day (every meal & bedtime) °d. A  prescription for pain medication (such as oxycodone, hydrocodone, etc) should be given to you upon discharge.  Take your pain medication as prescribed.  °i. If you are having problems/concerns with the prescription medicine (does not control pain, nausea, vomiting, rash, itching, etc), please call us [phone] to see if we need to switch you to a different pain medicine that will work better for you and/or control your side effect better. °ii. If you need a refill on your pain medication, please contact your pharmacy.  They will contact our office to request authorization. Prescriptions will not be filled after 5 pm or on week-ends. ° °Use a Sitz Bath 4-8 times a day for relief ° ° °Sitz Bath °A sitz bath is a warm water bath taken in the sitting position that covers only the hips and buttocks. It may be used for either healing or hygiene purposes. Sitz baths are also used to relieve pain, itching, or muscle spasms. The water may contain medicine. Moist heat will help you heal and relax.  °HOME CARE INSTRUCTIONS  °Take 3 to 4 sitz baths a day. °1. Fill the bathtub   half full with warm water. °2. Sit in the water and open the drain a little. °3. Turn on the warm water to keep the tub half full. Keep the water running constantly. °4. Soak in the water for 15 to 20 minutes. °5. After the sitz bath, pat the affected area dry first. ° ° °4. KEEP YOUR BOWELS REGULAR °a. The goal is one bowel movement a day °b. Avoid getting constipated.  Between the surgery and the pain medications, it  is common to experience some constipation.  Increasing fluid intake and taking a fiber supplement (such as Metamucil, Citrucel, FiberCon, MiraLax, etc) 1-2 times a day regularly will usually help prevent this problem from occurring.  A mild laxative (prune juice, Milk of Magnesia, MiraLax, etc) should be taken according to package directions if there are no bowel movements after 48 hours. °c. Watch out for diarrhea.  If you have many loose bowel movements, simplify your diet to bland foods & liquids for a few days.  Stop any stool softeners and decrease your fiber supplement.  Switching to mild anti-diarrheal medications (Kayopectate, Pepto Bismol) can help.  If this worsens or does not improve, please call us. ° °5. Wound Care ° °a. Remove your bandages the day after surgery.  Unless discharge instructions indicate otherwise, leave your bandage dry and in place overnight.  Remove the bandage during your first bowel movement.   °b. Wear an absorbent pad or soft cotton gauze in your underwear as needed to catch any drainage and help keep the area  °c. Keep the area clean and dry.  Bathe / shower every day.  Keep the area clean by showering / bathing over the incision / wound.   It is okay to soak an open wound to help wash it.  Wet wipes or showers / gentle washing after bowel movements is often less traumatic than regular toilet paper. °d. You will often notice bleeding with bowel movements.  This should slow down by the end of the first week of surgery °e. Expect some drainage.  This should slow down, too, by the end of the first week of surgery.  Wear an absorbent pad or soft cotton gauze in your underwear until the drainage stops. ° °6. ACTIVITIES as tolerated:   °a. You may resume regular (light) daily activities beginning the next day--such as daily self-care, walking, climbing stairs--gradually increasing activities as tolerated.  If you can walk 30 minutes without difficulty, it is safe to try more intense  activity such as jogging, treadmill, bicycling, low-impact aerobics, swimming, etc. °b. Save the most intensive and strenuous activity for last such as sit-ups, heavy lifting, contact sports, etc  Refrain from any heavy lifting or straining until you are off narcotics for pain control.   °c. DO NOT PUSH THROUGH PAIN.  Let pain be your guide: If it hurts to do something, don't do it.  Pain is your body warning you to avoid that activity for another week until the pain goes down. °d. You may drive when you are no longer taking prescription pain medication, you can comfortably sit for long periods of time, and you can safely maneuver your car and apply brakes. °e. You may have sexual intercourse when it is comfortable.  °7. FOLLOW UP in our office °a. Please call [facility] at [phone] to set up an appointment to see your surgeon in the office for a follow-up appointment approximately 2 weeks after your surgery. °b. Make sure that you call for   this appointment the day you arrive home to insure a convenient appointment time. 10. IF YOU HAVE DISABILITY OR FAMILY LEAVE FORMS, BRING THEM TO THE OFFICE FOR PROCESSING.  DO NOT GIVE THEM TO YOUR DOCTOR.        WHEN TO CALL us [phone]: 1. Poor pain control 2. Reactions / problems with new medications (rash/itching, nausea, etc)  3. Fever over 101.5 F (38.5 C) 4. Inability to urinate 5. Nausea and/or vomiting 6. Worsening swelling or bruising 7. Continued bleeding from incision. 8. Increased pain, redness, or drainage from the incision  The clinic staff is available to answer your questions during regular business hours (8:30am-5pm).  Please dont hesitate to call and ask to speak to one of our nurses for clinical concerns.   A surgeon from [facility] is always on call at the hospitals   If you have a medical emergency, go to the nearest emergency room or call 911.    [facility], [address], [place],  [place], [place]  [address] ? MAIN: [phone]) [phone] ? TOLL FREE: [phone] ? FAX [phone] [email or web address]    HEMORRHOIDS  The rectum is the last foot of your colon, and it naturally stretches to hold stool.  Hemorrhoidal piles are natural clusters of blood vessels that help the rectum and anal canal stretch to hold stool and allow bowel movements to eliminate feces.   Hemorrhoids are abnormally swollen blood vessels in the rectum.  Too much pressure in the rectum causes hemorrhoids by forcing blood to stretch and bulge the walls of the veins, sometimes even rupturing them.  Hemorrhoids can become like varicose veins you might see on a person's legs.  Most people will develop a flare of hemorrhoids in their lifetime.  When bulging hemorrhoidal veins are irritated, they can swell, burn, itch, cause pain, and bleed.  Most flares will calm down gradually own within a few weeks.  However, once hemorrhoids are created, they are difficult to get rid of completely and tend to flare more easily than the first flare.   Fortunately, good habits and simple medical treatment usually control hemorrhoids well, and surgery is needed only in severe cases. Types of Hemorrhoids:  Internal hemorrhoids usually don't initially hurt or itch; they are deep inside the rectum and usually have no sensation. If they begin to push out (prolapse), pain and burning can occur.  However, internal hemorrhoids can bleed.  Anal bleeding should not be ignored since bleeding could come from a dangerous source like colorectal cancer, so persistent rectal bleeding should be investigated by a doctor, sometimes with a colonoscopy.  External hemorrhoids cause most of the symptoms - pain, burning, and itching. Nonirritated hemorrhoids can look like small skin tags coming out of the anus.   Thrombosed hemorrhoids can form when a hemorrhoid blood vessel bursts and causes the hemorrhoid to suddenly swell.  A purple blood clot can  form in it and become an excruciatingly painful lump at the anus. Because of these unpleasant symptoms, immediate incision and drainage by a surgeon at an office visit can provide much relief of the pain.    PREVENTION Avoiding the most frequent causes listed below will prevent most cases of hemorrhoids: Constipation Hard stools Diarrhea  Constant sitting  Straining with bowel movements Sitting on the toilet for a long time  Severe coughing  episodes Pregnancy / Childbirth  Heavy Lifting  Sometimes avoiding the above triggers is difficult:  How can you avoid sitting  all day if you have a seated job? Also, we try to avoid coughing and diarrhea, but sometimes its beyond your control.  Still, there are some practical hints to help: Keep the anal and genital area clean.  Moistened tissues such as flushable wet wipes are less irritating than toilet paper.  Using irrigating showers or bottle irrigation washing gently cleans this sensitive area.   Avoid dry toilet paper when cleaning after bowel movements.  Marland Kitchen Keep the anal and genital area dry.  Lightly pat the rectal area dry.  Avoid rubbing.  Talcum or baby powders can help GET YOUR STOOLS SOFT.   This is the most important way to prevent irritated hemorrhoids.  Hard stools are like sandpaper to the anorectal canal and will cause more problems.  The goal: ONE SOFT BOWEL MOVEMENT A DAY!  BMs from every other day to 3 times a day is a tolerable range Treat coughing, diarrhea and constipation early since irritated hemorrhoids may soon follow.  If your main job activity is seated, always stand or walk during your breaks. Make it a point to stand and walk at least 5 minutes every hour and try to shift frequently in your chair to avoid direct rectal pressure.  Always exhale as you strain or lift. Don't hold your breath.  Do not delay or try to prevent a bowel movement when the urge is present. Exercise regularly (walking or jogging 60 minutes a day) to  stimulate the bowels to move. No reading or other activity while on the toilet. If bowel movements take longer than 5 minutes, you are too constipated. AVOID CONSTIPATION Drink plenty of liquids (1 1/2 to 2 quarts of water and other fluids a day unless fluid restricted for another medical condition). Liquids that contain caffeine (coffee a, tea, soft drinks) can be dehydrating and should be avoided until constipation is controlled. Consider minimizing milk, as dairy products may be constipating. Eat plenty of fiber (30g a day ideal, more if needed).  Fiber is the undigested part of plant food that passes into the colon, acting as natures broom to encourage bowel motility and movement.  Fiber can absorb and hold large amounts of water. This results in a larger, bulkier stool, which is soft and easier to pass.  Eating foods high in fiber - 12 servings - such as  Vegetables: Root (potatoes, carrots, turnips), Leafy green (lettuce, salad greens, celery, spinach), High residue (cabbage, broccoli, etc.) Fruit: Fresh, Dried (prunes, apricots, cherries), Stewed (applesauce)  Whole grain breads, pasta, whole wheat Bran cereals, muffins, etc. Consider adding supplemental bulking fiber which retains large volumes of water: Psyllium ground seeds (native plant from [place])--available as Metamucil, Konsyl, Effersyllium, Per Diem Fiber, or the less expensive generic forms.  Citrucel  (methylcellulose wood fiber) . FiberCon (Polycarbophil) Polyethylene Glycol - and artificial fiber commonly called Miralax or Glycolax.  It is helpful for people with gassy or bloated feelings with regular fiber Flax Seed - a less gassy natural fiber  Laxatives can be useful for a short period if constipation is severe Osmotics (Milk of Magnesia, Fleets Phospho-Soda, Magnesium Citrate)  Stimulants (Senokot,   Castor Oil,  Dulcolax, Ex-Lax)    Laxatives are not a good long-term solution as it can stress the bowels and cause  too much mineral loss and dehydration.   Avoid taking laxatives for more than 7 days in a row.  AVOID DIARRHEA Switch to liquids and simpler foods for a few days to avoid stressing your intestines further.  Avoid dairy products (especially milk & ice cream) for a short time.  The intestines often can lose the ability to digest lactose when stressed. Avoid foods that cause gassiness or bloating.  Typical foods include beans and other legumes, cabbage, broccoli, and dairy foods.  Every person has some sensitivity to other foods, so listen to your body and avoid those foods that trigger problems for you. Adding fiber (Citrucel, Metamucil, FiberCon, Flax seed, Miralax) gradually can help thicken stools by absorbing excess fluid and retrain the intestines to act more normally.  Slowly increase the dose over a few weeks.  Too much fiber too soon can backfire and cause cramping & bloating. Probiotics (such as active yogurt, Align, etc) may help repopulate the intestines and colon with normal bacteria and calm down a sensitive digestive tract.  Most studies show it to be of mild help, though, and such products can be costly. Medicines: Bismuth subsalicylate (ex. Kayopectate, Pepto Bismol) every 30 minutes for up to 6 doses can help control diarrhea.  Avoid if pregnant. Loperamide (Immodium) can slow down diarrhea.  Start with two tablets (4mg  total) first and then try one tablet every 6 hours.  Avoid if you are having fevers or severe pain.  If you are not better or start feeling worse, stop all medicines and call your doctor for advice Call your doctor if you are getting worse or not better.  Sometimes further testing (cultures, endoscopy, X-ray studies, bloodwork, etc) may be needed to help diagnose and treat the cause of the diarrhea.  TROUBLESHOOTING IRREGULAR BOWELS 1) Avoid extremes of bowel movements (no bad constipation/diarrhea) 2) Miralax 17gm mixed in 8oz. water or juice-daily. May use BID as  needed.  3) Gas-x,Phazyme, etc. as needed for gas & bloating.  4) Soft,bland diet. No spicy,greasy,fried foods.  5) Prilosec over-the-counter as needed  6) May hold gluten/wheat products from diet to see if symptoms improve.  7)  May try probiotics (Align, Activa, etc) to help calm the bowels down 7) If symptoms become worse call back immediately.   TREATMENT OF HEMORRHOID FLARE If these preventive measures fail, you must take action right away! Hemorrhoids are one condition that can be mild in the morning and become intolerable by nightfall. Most hemorrhoidal flares take several weeks to calm down.  These suggestions can help: Warm soaks.  This helps more than any topical medication.  Use up to 8 times a day.  Usually sitz baths or sitting in a warm bathtub helps.  Sitting on moist warm towels are helpful.  Switching to ice packs/cool compresses can be helpful  Use a Sitz Bath 4-8 times a day for relief A sitz bath is a warm water bath taken in the sitting position that covers only the hips and buttocks. It may be used for either healing or hygiene purposes. Sitz baths are also used to relieve pain, itching, or muscle spasms. The water may contain medicine. Moist heat will help you heal and relax.  HOME CARE INSTRUCTIONS  Take 3 to 4 sitz baths a day. 6. Fill the bathtub half full with warm water. 7. Sit in the water and open the drain a little. 8. Turn on the warm water to keep the tub half full. Keep the water running constantly. 9. Soak in the water for 15 to 20 minutes. 10. After the sitz bath, pat the affected area dry first. SEEK MEDICAL CARE IF:  You get worse instead of better. Stop the sitz baths if you get worse.  Normalize your bowels.  Extremes of diarrhea or constipation will make hemorrhoids worse.  One soft bowel movement a day is the goal.  Fiber can help get your bowels regular Wet wipes instead of toilet paper Pain control with a NSAID such as ibuprofen (Advil) or  naproxen (Aleve) or acetaminophen (Tylenol) around the clock.  Narcotics are constipating and should be minimized if possible Topical creams contain steroids (bydrocortisone) or local anesthetic (xylocaine) can help make pain and itching more tolerable.   EVALUATION If hemorrhoids are still causing problems, you could benefit by an evaluation by a surgeon.  The surgeon will obtain a history and examine you.  If hemorrhoids are diagnosed, some therapies can be offered in the office, usually with an anoscope into the less sensitive area of the rectum: -injection of hemorrhoids (sclerotherapy) can scar the blood vessels of the swollen/enlarged hemorrhoids to help shrink them down to a more normal size -rubber banding of the enlarged hemorrhoids to help shrink them down to a more normal size -drainage of the blood clot causing a thrombosed hemorrhoid,  to relieve the severe pain   While 90% of the time such problems from hemorrhoids can be managed without preceding to surgery, sometimes the hemorrhoids require a operation to control the problem (uncontrolled bleeding, prolapse, pain, etc.).   This involves being placed under general anesthesia where the surgeon can confirm the diagnosis and remove, suture, or staple the hemorrhoid(s).  Your surgeon can help you treat the problem appropriately.    Post Anesthesia Home Care Instructions  Activity: Get plenty of rest for the remainder of the day. A responsible individual must stay with you for 24 hours following the procedure.  For the next 24 hours, DO NOT: -Drive a car -Paediatric nurse -Drink alcoholic beverages -Take any medication unless instructed by your physician -Make any legal decisions or sign important papers.  Meals: Start with liquid foods such as gelatin or soup. Progress to regular foods as tolerated. Avoid greasy, spicy, heavy foods. If nausea and/or vomiting occur, drink only clear liquids until the nausea and/or vomiting subsides.  Call your physician if vomiting continues.  Special Instructions/Symptoms: Your throat may feel dry or sore from the anesthesia or the breathing tube placed in your throat during surgery. If this causes discomfort, gargle with warm salt water. The discomfort should disappear within 24 hours.  If you had a scopolamine patch placed behind your ear for the management of post- operative nausea and/or vomiting:  1. The medication in the patch is effective for 72 hours, after which it should be removed.  Wrap patch in a tissue and discard in the trash. Wash hands thoroughly with soap and water. 2. You may remove the patch earlier than 72 hours if you experience unpleasant side effects which may include dry mouth, dizziness or visual disturbances. 3. Avoid touching the patch. Wash your hands with soap and water after contact with the patch.   Information for Discharge Teaching: EXPAREL (bupivacaine liposome injectable suspension)   Your surgeon gave you EXPAREL(bupivacaine) in your surgical incision to help control your pain after surgery.   EXPAREL is a local anesthetic that provides pain relief by numbing the tissue around the surgical site.  EXPAREL is designed to release pain medication over time and can control pain for up to 72 hours.  Depending on how you respond to EXPAREL, you may require less pain medication during your recovery.  Possible side effects:  Temporary loss of sensation or ability to move in  the area where bupivacaine was injected.  Nausea, vomiting, constipation  Rarely, numbness and tingling in your mouth or lips, lightheadedness, or anxiety may occur.  Call your doctor right away if you think you may be experiencing any of these sensations, or if you have other questions regarding possible side effects.  Follow all other discharge instructions given to you by your surgeon or nurse. Eat a healthy diet and drink plenty of water or other fluids.  If you return to the  hospital for any reason within 96 hours following the administration of EXPAREL, please inform your health care providers.

## 2016-10-28 NOTE — Transfer of Care (Signed)
Immediate Anesthesia Transfer of Care Note  Patient: Cameron Kemp  Procedure(s) Performed: Procedure(s): EXAM UNDER ANESTHESIA WITH HEMORRHOIDECTOMY HEMORRHOIDAL LIGATION PEXY (N/A) EXCISION ANAL CANAL MASS (N/A)  Patient Location: PACU  Anesthesia Type:General  Level of Consciousness: awake, alert , oriented and patient cooperative  Airway & Oxygen Therapy: Patient Spontanous Breathing and Patient connected to nasal cannula oxygen  Post-op Assessment: Report given to RN and Post -op Vital signs reviewed and stable  Post vital signs: Reviewed and stable  Last Vitals:  Vitals:   10/28/16 0600  BP: 137/76  Pulse: 60  Resp: 18  Temp: 36.8 C    Last Pain:  Vitals:   10/28/16 0600  TempSrc: Oral      Patients Stated Pain Goal: 7 (73/71/06 2694)  Complications: No apparent anesthesia complications

## 2016-10-29 ENCOUNTER — Encounter (HOSPITAL_BASED_OUTPATIENT_CLINIC_OR_DEPARTMENT_OTHER): Payer: Self-pay | Admitting: Surgery

## 2016-11-23 ENCOUNTER — Ambulatory Visit: Payer: Self-pay | Admitting: Surgery

## 2017-01-07 ENCOUNTER — Ambulatory Visit: Payer: Self-pay | Admitting: Surgery

## 2017-02-16 NOTE — Patient Instructions (Signed)
HILBERT BRIGGS  02/16/2017   Your procedure is scheduled on: 03/03/2017    Report to [facility] Main  Entrance Take [place]  elevators to 3rd floor to  [place] at    [place] AM.    Call this number if you have problems the morning of surgery [phone]    Remember: ONLY 1 PERSON MAY GO WITH YOU TO SHORT STAY TO GET  READY MORNING OF [place].  Do not eat food or drink liquids :After Midnight.     Take these medicines the morning of surgery with A SIP OF WATER: metoprolol ( Lopressor)                                 You may not have any metal on your body including hair pins and              piercings  Do not wear jewelry, , lotions, powders or perfumes, deodorant             .              Men may shave face and neck.   Do not bring valuables to the hospital. [place].  Contacts, dentures or bridgework may not be worn into surgery.      Patients discharged the day of surgery will not be allowed to drive home.  Name and phone number of your driver:                Please read over the following fact sheets you were given: _____________________________________________________________________             [facility] - Preparing for Surgery Before surgery, you can play an important role.  Because skin is not sterile, your skin needs to be as free of germs as possible.  You can reduce the number of germs on your skin by washing with CHG (chlorahexidine gluconate) soap before surgery.  CHG is an antiseptic cleaner which kills germs and bonds with the skin to continue killing germs even after washing. Please DO NOT use if you have an allergy to CHG or antibacterial soaps.  If your skin becomes reddened/irritated stop using the CHG and inform your nurse when you arrive at Short Stay. Do not shave (including legs and underarms) for at least 48 hours prior to the first CHG shower.  You may shave your  face/neck. Please follow these instructions carefully:  1.  Shower with CHG Soap the night before surgery and the  morning of Surgery.  2.  If you choose to wash your hair, wash your hair first as usual with your  normal  shampoo.  3.  After you shampoo, rinse your hair and body thoroughly to remove the  shampoo.                           4.  Use CHG as you would any other liquid soap.  You can apply chg directly  to the skin and wash                       Gently with a scrungie or clean washcloth.  5.  Apply the CHG Soap to your body ONLY FROM THE NECK DOWN.   Do not use on face/ open                           Wound or open sores. Avoid contact with eyes, ears mouth and genitals (private parts).                       Wash face,  Genitals (private parts) with your normal soap.             6.  Wash thoroughly, paying special attention to the area where your surgery  will be performed.  7.  Thoroughly rinse your body with warm water from the neck down.  8.  DO NOT shower/wash with your normal soap after using and rinsing off  the CHG Soap.                9.  Pat yourself dry with a clean towel.            10.  Wear clean pajamas.            11.  Place clean sheets on your bed the night of your first shower and do not  sleep with pets. Day of Surgery : Do not apply any lotions/deodorants the morning of surgery.  Please wear clean clothes to the hospital/surgery center.  FAILURE TO FOLLOW THESE INSTRUCTIONS MAY RESULT IN THE CANCELLATION OF YOUR SURGERY PATIENT SIGNATURE_________________________________  NURSE SIGNATURE__________________________________  ________________________________________________________________________

## 2017-02-19 ENCOUNTER — Encounter (HOSPITAL_COMMUNITY)
Admission: RE | Admit: 2017-02-19 | Discharge: 2017-02-19 | Disposition: A | Discharge status: Home / Self Care | Payer: 59 | Source: Ambulatory Visit | Attending: Surgery | Admitting: Surgery

## 2017-02-19 ENCOUNTER — Encounter (HOSPITAL_COMMUNITY): Payer: Self-pay

## 2017-02-19 DIAGNOSIS — K432 Incisional hernia without obstruction or gangrene: Secondary | ICD-10-CM | POA: Diagnosis not present

## 2017-02-19 DIAGNOSIS — K4020 Bilateral inguinal hernia, without obstruction or gangrene, not specified as recurrent: Secondary | ICD-10-CM | POA: Diagnosis not present

## 2017-02-19 DIAGNOSIS — Z01818 Encounter for other preprocedural examination: Secondary | ICD-10-CM | POA: Diagnosis present

## 2017-02-19 LAB — CBC
HEMATOCRIT: 42.7 % (ref 39.0–52.0)
Hemoglobin: 15.2 g/dL (ref 13.0–17.0)
MCH: 33.8 pg (ref 26.0–34.0)
MCHC: 35.6 g/dL (ref 30.0–36.0)
MCV: 94.9 fL (ref 78.0–100.0)
Platelets: 165 10*3/uL (ref 150–400)
RBC: 4.5 MIL/uL (ref 4.22–5.81)
RDW: 12.5 % (ref 11.5–15.5)
WBC: 6.4 10*3/uL (ref 4.0–10.5)

## 2017-02-19 LAB — BASIC METABOLIC PANEL
Anion gap: 8 (ref 5–15)
BUN: 13 mg/dL (ref 6–20)
CHLORIDE: 103 mmol/L (ref 101–111)
CO2: 28 mmol/L (ref 22–32)
Calcium: 9.3 mg/dL (ref 8.9–10.3)
Creatinine, Ser: 0.81 mg/dL (ref 0.61–1.24)
GFR calc non Af Amer: >60 mL/min (ref >60)
Glucose, Bld: 123 mg/dL — ABNORMAL HIGH (ref 65–99)
Potassium: 3.6 mmol/L (ref 3.5–5.1)
SODIUM: 139 mmol/L (ref 135–145)

## 2017-02-19 NOTE — Progress Notes (Signed)
ekg-10/28/16-epic

## 2017-02-19 NOTE — Progress Notes (Signed)
10/28/16-ekg-epic

## 2017-03-02 MED ORDER — CLINDAMYCIN PHOSPHATE 900 MG/50ML IV SOLN
900.0000 mg | INTRAVENOUS | Status: AC
  Administered 2017-03-03: 900 mg via INTRAVENOUS
  Filled 2017-03-02: qty 50

## 2017-03-02 MED ORDER — GENTAMICIN SULFATE 40 MG/ML IJ SOLN
420.0000 mg | INTRAVENOUS | Status: AC
  Administered 2017-03-03: 420 mg via INTRAVENOUS
  Filled 2017-03-02: qty 10.5

## 2017-03-02 NOTE — Anesthesia Preprocedure Evaluation (Addendum)
Anesthesia Evaluation  Patient identified by MRN, date of birth, ID band Patient awake    Reviewed: Allergy & Precautions, H&P , NPO status , Patient's Chart, lab work & pertinent test results  Airway Mallampati: III  TM Distance: >3 FB Neck ROM: Full    Dental no notable dental hx. (+) Teeth Intact, Dental Advisory Given   Pulmonary neg pulmonary ROS, former smoker,    Pulmonary exam normal breath sounds clear to auscultation       Cardiovascular Exercise Tolerance: Good hypertension, Pt. on medications and Pt. on home beta blockers  Rhythm:Regular Rate:Normal     Neuro/Psych Anxiety negative neurological ROS  negative psych ROS   GI/Hepatic negative GI ROS, Neg liver ROS,   Endo/Other  negative endocrine ROS  Renal/GU negative Renal ROS  negative genitourinary   Musculoskeletal  (+) Arthritis , Osteoarthritis,    Abdominal   Peds  Hematology negative hematology ROS (+)   Anesthesia Other Findings   Reproductive/Obstetrics negative OB ROS                            Anesthesia Physical Anesthesia Plan  ASA: II  Anesthesia Plan: General   Post-op Pain Management:    Induction: Intravenous  PONV Risk Score and Plan: 3 and Ondansetron, Dexamethasone and Midazolam  Airway Management Planned: Oral ETT  Additional Equipment:   Intra-op Plan:   Post-operative Plan: Extubation in OR  Informed Consent: I have reviewed the patients History and Physical, chart, labs and discussed the procedure including the risks, benefits and alternatives for the proposed anesthesia with the patient or authorized representative who has indicated his/her understanding and acceptance.   Dental advisory given  Plan Discussed with: CRNA  Anesthesia Plan Comments:        Anesthesia Quick Evaluation

## 2017-03-03 ENCOUNTER — Ambulatory Visit (HOSPITAL_COMMUNITY): Payer: 59 | Admitting: Anesthesiology

## 2017-03-03 ENCOUNTER — Encounter (HOSPITAL_COMMUNITY): Payer: Self-pay | Admitting: *Deleted

## 2017-03-03 ENCOUNTER — Ambulatory Visit (HOSPITAL_COMMUNITY)
Admission: RE | Admit: 2017-03-03 | Discharge: 2017-03-03 | Disposition: A | Discharge status: Home / Self Care | Payer: 59 | Source: Ambulatory Visit | Attending: Surgery | Admitting: Surgery

## 2017-03-03 ENCOUNTER — Encounter (HOSPITAL_COMMUNITY)
Admission: RE | Disposition: A | Discharge status: Home / Self Care | Payer: Self-pay | Source: Ambulatory Visit | Attending: Surgery

## 2017-03-03 DIAGNOSIS — Z79899 Other long term (current) drug therapy: Secondary | ICD-10-CM | POA: Diagnosis not present

## 2017-03-03 DIAGNOSIS — K420 Umbilical hernia with obstruction, without gangrene: Secondary | ICD-10-CM | POA: Diagnosis present

## 2017-03-03 DIAGNOSIS — Z87891 Personal history of nicotine dependence: Secondary | ICD-10-CM | POA: Diagnosis not present

## 2017-03-03 DIAGNOSIS — E669 Obesity, unspecified: Secondary | ICD-10-CM | POA: Diagnosis not present

## 2017-03-03 DIAGNOSIS — K436 Other and unspecified ventral hernia with obstruction, without gangrene: Secondary | ICD-10-CM | POA: Diagnosis not present

## 2017-03-03 DIAGNOSIS — F419 Anxiety disorder, unspecified: Secondary | ICD-10-CM | POA: Diagnosis not present

## 2017-03-03 DIAGNOSIS — I10 Essential (primary) hypertension: Secondary | ICD-10-CM | POA: Insufficient documentation

## 2017-03-03 DIAGNOSIS — K4020 Bilateral inguinal hernia, without obstruction or gangrene, not specified as recurrent: Secondary | ICD-10-CM | POA: Diagnosis not present

## 2017-03-03 HISTORY — PX: VENTRAL HERNIA REPAIR: SHX424

## 2017-03-03 HISTORY — PX: INSERTION OF MESH: SHX5868

## 2017-03-03 HISTORY — PX: INGUINAL HERNIA REPAIR: SHX194

## 2017-03-03 SURGERY — REPAIR, HERNIA, INGUINAL, BILATERAL, LAPAROSCOPIC
Anesthesia: General | Site: Abdomen

## 2017-03-03 MED ORDER — CELECOXIB 200 MG PO CAPS
400.0000 mg | ORAL_CAPSULE | ORAL | Status: AC
  Administered 2017-03-03: 400 mg via ORAL
  Filled 2017-03-03: qty 2

## 2017-03-03 MED ORDER — SUGAMMADEX SODIUM 200 MG/2ML IV SOLN
INTRAVENOUS | Status: AC
  Filled 2017-03-03: qty 2

## 2017-03-03 MED ORDER — ROCURONIUM BROMIDE 50 MG/5ML IV SOSY
PREFILLED_SYRINGE | INTRAVENOUS | Status: AC
  Filled 2017-03-03: qty 5

## 2017-03-03 MED ORDER — METHOCARBAMOL 750 MG PO TABS: 750.0000 mg | ORAL_TABLET | Freq: Four times a day (QID) | ORAL | 2 refills | Status: AC | PRN

## 2017-03-03 MED ORDER — BUPIVACAINE LIPOSOME 1.3 % IJ SUSP
20.0000 mL | INTRAMUSCULAR | Status: DC
  Filled 2017-03-03: qty 20

## 2017-03-03 MED ORDER — BUPIVACAINE-EPINEPHRINE (PF) 0.25% -1:200000 IJ SOLN
INTRAMUSCULAR | Status: AC
  Filled 2017-03-03: qty 30

## 2017-03-03 MED ORDER — MIDAZOLAM HCL 2 MG/2ML IJ SOLN
INTRAMUSCULAR | Status: AC
  Filled 2017-03-03: qty 2

## 2017-03-03 MED ORDER — PROPOFOL 10 MG/ML IV BOLUS
INTRAVENOUS | Status: DC | PRN
  Administered 2017-03-03: 200 mg via INTRAVENOUS

## 2017-03-03 MED ORDER — ACETAMINOPHEN 500 MG PO TABS
1000.0000 mg | ORAL_TABLET | ORAL | Status: AC
  Administered 2017-03-03: 1000 mg via ORAL
  Filled 2017-03-03: qty 2

## 2017-03-03 MED ORDER — HYDROMORPHONE HCL-NACL 0.5-0.9 MG/ML-% IV SOSY
PREFILLED_SYRINGE | INTRAVENOUS | Status: AC
  Filled 2017-03-03: qty 2

## 2017-03-03 MED ORDER — LIDOCAINE 2% (20 MG/ML) 5 ML SYRINGE
INTRAMUSCULAR | Status: DC | PRN
  Administered 2017-03-03: 100 mg via INTRAVENOUS

## 2017-03-03 MED ORDER — FENTANYL CITRATE (PF) 100 MCG/2ML IJ SOLN
INTRAMUSCULAR | Status: AC
  Filled 2017-03-03: qty 2

## 2017-03-03 MED ORDER — ROCURONIUM BROMIDE 50 MG/5ML IV SOSY
PREFILLED_SYRINGE | INTRAVENOUS | Status: DC | PRN
  Administered 2017-03-03: 20 mg via INTRAVENOUS
  Administered 2017-03-03: 10 mg via INTRAVENOUS
  Administered 2017-03-03: 30 mg via INTRAVENOUS

## 2017-03-03 MED ORDER — BUPIVACAINE-EPINEPHRINE 0.25% -1:200000 IJ SOLN
INTRAMUSCULAR | Status: DC | PRN
  Administered 2017-03-03: 80 mL

## 2017-03-03 MED ORDER — DEXAMETHASONE SODIUM PHOSPHATE 10 MG/ML IJ SOLN
INTRAMUSCULAR | Status: AC
  Filled 2017-03-03: qty 1

## 2017-03-03 MED ORDER — HYDROMORPHONE HCL-NACL 0.5-0.9 MG/ML-% IV SOSY: 0.2500 mg | PREFILLED_SYRINGE | INTRAVENOUS | Status: DC | PRN

## 2017-03-03 MED ORDER — BUPIVACAINE-EPINEPHRINE 0.25% -1:200000 IJ SOLN
INTRAMUSCULAR | Status: AC
  Filled 2017-03-03: qty 1

## 2017-03-03 MED ORDER — OXYCODONE HCL 5 MG PO TABS: 10.0000 mg | ORAL_TABLET | ORAL | Status: DC | PRN

## 2017-03-03 MED ORDER — CHLORHEXIDINE GLUCONATE CLOTH 2 % EX PADS: 6.0000 | MEDICATED_PAD | Freq: Once | CUTANEOUS | Status: DC

## 2017-03-03 MED ORDER — OXYCODONE HCL 5 MG PO TABS
5.0000 mg | ORAL_TABLET | ORAL | Status: DC | PRN
  Administered 2017-03-03: 5 mg via ORAL
  Filled 2017-03-03: qty 1

## 2017-03-03 MED ORDER — OXYCODONE HCL 5 MG PO TABS: 5.0000 mg | ORAL_TABLET | ORAL | 0 refills | Status: DC | PRN

## 2017-03-03 MED ORDER — BUPIVACAINE LIPOSOME 1.3 % IJ SUSP
INTRAMUSCULAR | Status: DC | PRN
  Administered 2017-03-03: 20 mL

## 2017-03-03 MED ORDER — STERILE WATER FOR IRRIGATION IR SOLN
Status: DC | PRN
  Administered 2017-03-03: 1000 mL

## 2017-03-03 MED ORDER — PROPOFOL 10 MG/ML IV BOLUS
INTRAVENOUS | Status: AC
  Filled 2017-03-03: qty 20

## 2017-03-03 MED ORDER — ONDANSETRON HCL 4 MG/2ML IJ SOLN
INTRAMUSCULAR | Status: AC
  Filled 2017-03-03: qty 2

## 2017-03-03 MED ORDER — 0.9 % SODIUM CHLORIDE (POUR BTL) OPTIME
TOPICAL | Status: DC | PRN
  Administered 2017-03-03: 1000 mL

## 2017-03-03 MED ORDER — LACTATED RINGERS IV SOLN
INTRAVENOUS | Status: DC
  Administered 2017-03-03: 07:00:00 via INTRAVENOUS

## 2017-03-03 MED ORDER — SUCCINYLCHOLINE CHLORIDE 200 MG/10ML IV SOSY
PREFILLED_SYRINGE | INTRAVENOUS | Status: DC | PRN
  Administered 2017-03-03: 120 mg via INTRAVENOUS

## 2017-03-03 MED ORDER — GABAPENTIN 300 MG PO CAPS
300.0000 mg | ORAL_CAPSULE | ORAL | Status: AC
  Administered 2017-03-03: 300 mg via ORAL
  Filled 2017-03-03: qty 1

## 2017-03-03 MED ORDER — ONDANSETRON HCL 4 MG/2ML IJ SOLN
INTRAMUSCULAR | Status: DC | PRN
  Administered 2017-03-03: 4 mg via INTRAVENOUS

## 2017-03-03 MED ORDER — MIDAZOLAM HCL 2 MG/2ML IJ SOLN
INTRAMUSCULAR | Status: DC | PRN
  Administered 2017-03-03: 2 mg via INTRAVENOUS

## 2017-03-03 MED ORDER — NAPROXEN 500 MG PO TABS: 500.0000 mg | ORAL_TABLET | Freq: Two times a day (BID) | ORAL | 1 refills | Status: DC

## 2017-03-03 MED ORDER — LIDOCAINE 2% (20 MG/ML) 5 ML SYRINGE
INTRAMUSCULAR | Status: AC
  Filled 2017-03-03: qty 5

## 2017-03-03 MED ORDER — FENTANYL CITRATE (PF) 100 MCG/2ML IJ SOLN
INTRAMUSCULAR | Status: DC | PRN
  Administered 2017-03-03 (×2): 25 ug via INTRAVENOUS
  Administered 2017-03-03: 50 ug via INTRAVENOUS

## 2017-03-03 MED ORDER — SUGAMMADEX SODIUM 200 MG/2ML IV SOLN
INTRAVENOUS | Status: DC | PRN
  Administered 2017-03-03: 200 mg via INTRAVENOUS

## 2017-03-03 MED ORDER — SUCCINYLCHOLINE CHLORIDE 200 MG/10ML IV SOSY
PREFILLED_SYRINGE | INTRAVENOUS | Status: AC
  Filled 2017-03-03: qty 10

## 2017-03-03 MED ORDER — DEXAMETHASONE SODIUM PHOSPHATE 10 MG/ML IJ SOLN
INTRAMUSCULAR | Status: DC | PRN
  Administered 2017-03-03: 10 mg via INTRAVENOUS

## 2017-03-03 MED ORDER — CLINDAMYCIN PHOSPHATE 900 MG/50ML IV SOLN
INTRAVENOUS | Status: AC
  Filled 2017-03-03: qty 50

## 2017-03-03 SURGICAL SUPPLY — 49 items
APPLIER CLIP 5 13 M/L LIGAMAX5 (MISCELLANEOUS)
BINDER ABDOMINAL 12 ML 46-62 (SOFTGOODS) ×4 IMPLANT
CABLE HIGH FREQUENCY MONO STRZ (ELECTRODE) ×4 IMPLANT
CHLORAPREP W/TINT 26ML (MISCELLANEOUS) ×4 IMPLANT
CLIP APPLIE 5 13 M/L LIGAMAX5 (MISCELLANEOUS) IMPLANT
CLOSURE WOUND 1/2 X4 (GAUZE/BANDAGES/DRESSINGS) ×1
COVER SURGICAL LIGHT HANDLE (MISCELLANEOUS) ×4 IMPLANT
DECANTER SPIKE VIAL GLASS SM (MISCELLANEOUS) ×4 IMPLANT
DEVICE PMI PUNCTURE CLOSURE (MISCELLANEOUS) ×4 IMPLANT
DEVICE SECURE STRAP 25 ABSORB (INSTRUMENTS) ×4 IMPLANT
DEVICE TROCAR PUNCTURE CLOSURE (ENDOMECHANICALS) IMPLANT
DRAPE WARM FLUID 44X44 (DRAPE) ×4 IMPLANT
DRSG TEGADERM 2-3/8X2-3/4 SM (GAUZE/BANDAGES/DRESSINGS) ×4 IMPLANT
DRSG TEGADERM 4X4.75 (GAUZE/BANDAGES/DRESSINGS) ×4 IMPLANT
ELECT REM PT RETURN 15FT ADLT (MISCELLANEOUS) ×4 IMPLANT
GAUZE SPONGE 2X2 8PLY STRL LF (GAUZE/BANDAGES/DRESSINGS) ×2 IMPLANT
GLOVE ECLIPSE 8.0 STRL XLNG CF (GLOVE) ×4 IMPLANT
GLOVE INDICATOR 8.0 STRL GRN (GLOVE) ×4 IMPLANT
GOWN STRL REUS W/TWL XL LVL3 (GOWN DISPOSABLE) ×12 IMPLANT
IRRIG SUCT STRYKERFLOW 2 WTIP (MISCELLANEOUS)
IRRIGATION SUCT STRKRFLW 2 WTP (MISCELLANEOUS) IMPLANT
KIT BASIN OR (CUSTOM PROCEDURE TRAY) ×4 IMPLANT
MARKER SKIN DUAL TIP RULER LAB (MISCELLANEOUS) ×4 IMPLANT
MESH ULTRAPRO 6X6 15CM15CM (Mesh General) ×12 IMPLANT
MESH VENTRALIGHT ST 6X8 (Mesh Specialty) ×2 IMPLANT
MESH VENTRLGHT ELLIPSE 8X6XMFL (Mesh Specialty) ×2 IMPLANT
NEEDLE INSUFFLATION 14GA 120MM (NEEDLE) IMPLANT
NEEDLE SPNL 22GX3.5 QUINCKE BK (NEEDLE) ×4 IMPLANT
PAD POSITIONING PINK XL (MISCELLANEOUS) ×4 IMPLANT
SCISSORS LAP 5X35 DISP (ENDOMECHANICALS) ×4 IMPLANT
SHEARS HARMONIC ACE PLUS 36CM (ENDOMECHANICALS) IMPLANT
SLEEVE ADV FIXATION 5X100MM (TROCAR) ×4 IMPLANT
SLEEVE XCEL OPT CAN 5 100 (ENDOMECHANICALS) ×12 IMPLANT
SPONGE GAUZE 2X2 STER 10/PKG (GAUZE/BANDAGES/DRESSINGS) ×2
STRIP CLOSURE SKIN 1/2X4 (GAUZE/BANDAGES/DRESSINGS) ×3 IMPLANT
SUT MNCRL AB 4-0 PS2 18 (SUTURE) ×4 IMPLANT
SUT PDS AB 1 CT1 27 (SUTURE) ×20 IMPLANT
SUT PROLENE 1 CT 1 30 (SUTURE) ×24 IMPLANT
SUT VIC AB 2-0 SH 27 (SUTURE) ×2
SUT VIC AB 2-0 SH 27X BRD (SUTURE) ×2 IMPLANT
SUT VICRYL 0 UR6 27IN ABS (SUTURE) ×4 IMPLANT
TACKER 5MM HERNIA 3.5CML NAB (ENDOMECHANICALS) IMPLANT
TOWEL OR 17X26 10 PK STRL BLUE (TOWEL DISPOSABLE) ×4 IMPLANT
TOWEL OR NON WOVEN STRL DISP B (DISPOSABLE) ×4 IMPLANT
TRAY LAPAROSCOPIC (CUSTOM PROCEDURE TRAY) ×4 IMPLANT
TROCAR BLADELESS OPT 5 100 (ENDOMECHANICALS) ×4 IMPLANT
TROCAR XCEL BLUNT TIP 100MML (ENDOMECHANICALS) ×4 IMPLANT
TROCAR XCEL NON-BLD 11X100MML (ENDOMECHANICALS) IMPLANT
TUBING INSUF HEATED (TUBING) ×4 IMPLANT

## 2017-03-03 NOTE — H&P (Signed)
Roselie Skinner  Location: [facility] Surgery Patient #: [id number] DOB: 08-27-1960 Married / Language: Cleophus Molt / Race: White Male  Patient Care Team: Jani Gravel, MD as PCP - General (Internal Medicine) Michael Boston, MD as Consulting Physician (General Surgery) Carol Ada, MD as Consulting Physician (Gastroenterology)   History of Present Illness Adin Hector MD; 01/05/2017 10:05 AM) The patient is a 56 year old male who presents with anal lesions. Note for "Anal lesions": ` ` ` The patient returns from excision of hemorrhoids and a prolapsing adenomatous polyp.  The returns to clinic after surgery, gradually improving. Pain from the perianal region has resolved. No itching or moisture. However occasionally feels like he has to go back and wipe again. No major leakage. No incontinence to flatus or stools. He is glad not to have a prolapsing mass anymore. He continued to move his bowels most morning. He keeps forgetting to take a fiber supplement of. Appetite and energy level rather good. His left groin hernia occasionally bothers him but not massively worse. No change in right inguinal and periumbilical hernias. Not as sensitive. Umbilical hernia may be slightly larger. Ready to proceed with surgery later in the summer. In good spirits overall Denies nausea, constipation/diarrhea, worsening fatigue, high fevers, or other concerns.  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 Excised. Posterior anal  canal smooth fibrous polyps most likely consistent with enlarged hypertrophic anal crypt polyps with some prolapsing. Excised. Persistent right posterior internal/external hemorrhoid. Excised.. Small but irritated left anterior external hemorrhoid excised.  Probable old posterior midline fissure with flat scar. Closed. Normal sphincter tone. No condyloma. No abscess. No fistula. No proctitis. No rectal prolapse / procidentia.  Diagnosis 1. Soft tissue, biopsy, posterior midline anal canal prolapsing mass ? adenoma - TUBULOVILLOUS ADENOMA. - MARGIN NOT INVOLVED BY ADENOMA. - BENIGN FIBROEPITHELIAL POLYP. - NO HIGH GRADE DYSPLASIA OR MALIGNANCY IDENTIFIED. 2. Soft tissue, biopsy, right posterior anal canal polyp - HEMORRHOID WITH FOCAL EROSION. - NO DYSPLASIA OR MALIGNANCY. 3. Fistula, questionable fissure - SQUAMOUS MUCOSA WITH HYPEREMIA AND FOCAL REACTIVE CHANGES. - NO DYSPLASIA OR MALIGNANCY. 4. Hemorrhoids, left anterior - HEMORRHOID WITH HYPERKERATOSIS. - NO DYSPLASIA OR MALIGNANCY. Claudette Laws MD Pathologist, Electronic Signature (Case signed 10/29/2016)           9:44 AM  PATIENT: Roselie Skinner 56 y.o. 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 Liposomal bupivacaine (Experel) at the end of the case.  EBL: Total I/O In: 850 [  I.V.:850] Out: 20 [Blood:20]. See operative record  Delay start of Pharmacological VTE agent (>24hrs) due to surgical blood loss or risk of bleeding: NO  DRAINS: NONE  SPECIMEN:   1. Posterior midline pedunculated prolapsing adenomatous polyp.  2. Anal canal fibroepithelial polyps x2.  3. Right posterior internal/external hemorrhoid. 4. Left anterior external hemorrhoid  DISPOSITION OF SPECIMEN: PATHOLOGY  COUNTS: YES  PLAN OF CARE: Discharge home after PACU  PATIENT DISPOSITION: PACU - hemodynamically stable.  INDICATION: Pleasant patient with struggles with hemorrhoids. Found to have prolapsing anal canal masses suspicious for a polyp. Also with grade 2-3 internal hemorrhoids. Not able to be managed in the office despite an improved bowel regimen. I recommended examination under anesthesia and surgical treatment:  The anatomy & physiology of the anorectal region was discussed. The pathophysiology of hemorrhoids and differential diagnosis was discussed. Natural history risks without surgery was discussed. I stressed the importance of a bowel regimen to have daily soft bowel movements to minimize progression of disease. Interventions such as sclerotherapy & banding were discussed.  The patient's symptoms are not adequately controlled by medicines and other non-operative treatments. I feel the risks & problems of no surgery outweigh the operative risks; therefore, I recommended surgery to treat the hemorrhoids by ligation, pexy, and possible resection.  Risks such as bleeding, infection, need for further treatment, heart attack, death, and other risks were discussed. I noted a good likelihood this will help address the problem. Goals of post-operative recovery were discussed as well. Possibility that this will not correct all symptoms was explained. Post-operative pain,  bleeding, constipation, urinary difficulties, and other problems after surgery were discussed. We will work to minimize complications. Educational handouts further explaining the pathology, treatment options, and bowel regimen were given as well. Questions were answered. The patient expresses understanding & wishes to proceed with surgery.  OR FINDINGS: Pedunculated posterior midline polyp at the anorectal junction. Excised. Posterior anal canal smooth fibrous polyps most likely consistent with enlarged hypertrophic anal crypt polyps with some prolapsing. Excised. Persistent right posterior internal/external hemorrhoid. Excised.. Small but irritated left anterior external hemorrhoid excised.  Probable old posterior midline fissure with flat scar. Closed. Normal sphincter tone. No condyloma. No abscess. No fistula. No proctitis. No rectal prolapse / procidentia.  DESCRIPTION:  Informed consent was confirmed. Patient underwent general anesthesia without difficulty. Patient was placed into prone positioning. The perianal region was prepped and draped in sterile fashion. Surgical time-out confirmed our plan.  I did digital rectal examination and then transitioned over to anoscopy to get a sense of the anatomy. Findings noted above.  I excised the prolapsing adenomatous posterior anal canal polyp. Almost end up excising the prolapsing anal canal fibroepithelial polyps as well. One larger resection and one smaller resection done along the posterior circumference, primarily resulting in a longitudinal wound along the right posterior anal canal . I used a 2-0 Vicryl suture on a UR-6 needle in a figure-of-eight fashion 6 cm proximal to the anal verge. I excised the prolapsing polyps and hemorrhoid involving the posterior circumference prior merely focused on the right posterior midline region in a fusiform biconcave fashion, sparing rectal wall & anoderm to avoid narrowing. I then ran that  stitch longitudinally more distally to close the hemorrhoidectomy wound to the anal verge over a large Hill-Furgeson retarctor to avoid narrowing of the anal canal. I then tied that stitch down to cause a hemorrhoidopexy. I did not and excising some posterior midline scar consistent with a prior anal fissure. Got  healthy or anoderm. I closed the external wounds transversely with 2-0 chromic suture, leaving the last 5 mm right posterior lateral aspect open to allow natural drainage.   Anteriorly I did do some hemorrhoidal ligations with 2-0 Vicryl suture with pexy on the right anterior and left anterior piles. Ended up excising a persistent left anterior external hemorrhoidal pile nearly closed that with 2-0 chromic interrupted horizontal mattress suture.   I redid anoscopy examination. At completion of this, all polyps and grade 3 hemorrhoid had been removed. The grade 2 hemorrhoids were markedly reduced down to more normal caliber. There is no prolapse. External anatomy looked much more normal. Hemostasis was good. Patient is being extubated go to go to the recovery room.  I had discussed postop care in detail with the patient in the preop holding area. Instructions for post-operative recovery and prescriptions are written. I discussed operative findings, updated the patient's status, discussed probable steps to recovery, and gave postoperative recommendations to the patient's family. Recommendations were made. Questions were answered. They expressed understanding & appreciation.  Adin Hector, M.D., F.A.C.S. Gastrointestinal and Minimally Invasive Surgery [facility] [address] [place], [place] [address] [phone] Main / Paging  Diagnosis 1. Soft tissue, biopsy, posterior midline anal canal prolapsing mass ? adenoma - TUBULOVILLOUS ADENOMA. - MARGIN NOT INVOLVED BY ADENOMA. - BENIGN FIBROEPITHELIAL POLYP. - NO HIGH GRADE  DYSPLASIA OR MALIGNANCY IDENTIFIED. 2. Soft tissue, biopsy, right posterior anal canal polyp - HEMORRHOID WITH FOCAL EROSION. - NO DYSPLASIA OR MALIGNANCY. 3. Fistula, questionable fissure - SQUAMOUS MUCOSA WITH HYPEREMIA AND FOCAL REACTIVE CHANGES. - NO DYSPLASIA OR MALIGNANCY. 4. Hemorrhoids, left anterior - HEMORRHOID WITH HYPERKERATOSIS. - NO DYSPLASIA OR MALIGNANCY. Claudette Laws MD Pathologist, Electronic Signature (Case signed 10/29/2016) Specimen Stanford Strauch and Clinical Information Specimen(s) Obtained: 1. Soft tissue, biopsy, posterior midline anal canal prolapsing mass ? adenoma 2. Soft tissue, biopsy, right posterior anal canal polyp 3. Fistula, questionable fissure 4. Hemorrhoids, left anterior Specimen Clinical Information 1. prolapsing anal mass symptomatic hemorrhoids (kp) Francies Inch 1. The specimen is received in formalin and consists of a 2.7 x 2.5 x 1.2 cm piece of tan-pink soft tissue with a cauterized resection margin. There is a 1.3 x 0.9 x 0.8 cm tan-pink polypoid structure identified on the surface, and a 1 of 2 FINAL for AAHAN, MARQUES [phone]) Gabe Glace(continued) 1.8 x 1.2 x 1.1 cm tan-red, hyperemic polypoid structure identified. The cauterized resection margin is inked black, and the specimen is serially sectioned. The larger polypoid structure displays a tan-red friable cut surface. The specimen is entirely submitted in four cassettes. 2. The specimen is received in formalin and consists of a 4.0 x 2.5 x 0.8 cm aggregate of tan-gray, wrinkled, focally cauterized soft tissue. One tissue fragment displays a 0.9 x 0.7 x 0.7 cm tan-red, hyperemic polypoid structure. The resection margin underlying the polyp is inked black, and the polyp is bisected. Representative sections are submitted in two cassettes. A = polypoid structure. B = sections from additional aggregate of tissue. 3. The specimen is received in formalin and consists of a 1.0 x 0.9 x 0.2 cm portion of  tan-red, focally hemorrhagic soft tissue. One aspect displays a smooth, glistening surface and the opposing aspect is roughened with possible cautery artifact. The specimen is trisected and entirely submitted in one cassette. 4. The specimen is received in formalin and consists of a 1.7 x 1.0 x 0.7 cm piece of tan-red, hyperemic, wrinkled soft tissue. Sectioning reveals  a tan-red cut surface. The specimen is bisected and entirely submitted in one cassette. 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 DiazePAM (5MG  Tablet, Oral) Active. Metoprolol Tartrate (25MG  Tablet, Oral) Active. HydroCHLOROthiazide (25MG  Tablet, Oral) Active. LORazepam (0.5MG  Tablet, Oral) Active. Medications Reconciled  Social History Adin Hector, MD; 01/05/2017 9:48 AM) Alcohol use  Moderate alcohol use. Caffeine use  Coffee. Illicit drug use  Remotely quit drug use. Tobacco use  Former smoker.  Family  History Adin Hector, MD; 01/05/2017 9:48 AM) Alcohol Abuse  Father. Arthritis  Brother, Mother. Colon Polyps  Brother, Mother. Hypertension  Brother, Mother.  Other Problems Adin Hector, MD; 01/05/2017 9:48 AM) Anxiety Disorder  Arthritis  Back Pain  Gastroesophageal Reflux Disease  Hemorrhoids  High blood pressure  Inguinal Hernia  Umbilical Hernia Repair   Vitals Dalbert Mayotte CMA; 01/05/2017 9:43 AM) 01/05/2017 9:43 AM Weight: 230 lb Height: 70in Body Surface Area: 2.22 m Body Mass Index: 33 kg/m  Temp.: 97.102F  Pulse: 55 (Regular)  BP: 144/84 (Sitting, Left Arm, Standard)  BP 119/74   Pulse (!) 58   Temp 98 F (36.7 C) (Oral)   Resp 18   Ht 5\' 10"  (1.778 m)   Wt 103.4 kg (228 lb)   SpO2 97%   BMI 32.71 kg/m       Physical Exam Adin Hector MD; 01/05/2017 9:56 AM) General Mental Status-Alert. General Appearance-Not in acute distress. Voice-Normal.  Integumentary Global Assessment Normal Exam - Distribution of scalp and body hair is normal. General Characteristics Overall Skin Surface - no rashes and no suspicious lesions.  Head and Neck Head-normocephalic, atraumatic with no lesions or palpable masses. Face Global Assessment - atraumatic, no absence of expression. Neck Global Assessment - no abnormal movements, no decreased range of motion. Trachea-midline. Thyroid Gland Characteristics - non-tender.  Eye Eyeball - Left-Extraocular movements intact, No Nystagmus. Eyeball - Right-Extraocular movements intact, No Nystagmus. Upper Eyelid - Left-No Cyanotic. Upper Eyelid - Right-No Cyanotic.  Chest and Lung Exam Inspection Accessory muscles - No use of accessory muscles in breathing.  Abdomen Inspection Inspection of the abdomen reveals - No Visible peristalsis and No Abnormal pulsations. Umbilicus - No Bleeding, No Urine drainage. Palpation/Percussion Palpation and Percussion of the  abdomen reveal - Soft, Non Tender, No Rebound tenderness, No Rigidity (guarding) and No Cutaneous hyperesthesia. Note: Abdomen soft. Nontender, nondistended. No guarding.   Mild diastasis. 2cm periumbilical mass partially reducible consistent with at least umbilical and possible small Swiss cheese ventral type hernia   Male Genitourinary Sexual Maturity Tanner 5 - Adult hair pattern and Adult penile size and shape. Note: Subtle impulse left greater  than right suspicious for bilateral inguinal hernias.   Normal external genitalia. Epididymi, testes, and spermatic cords normal without any masses.   Rectal Note: All perianal wounds healed. Normal sphincter tone. No purulence or abscess. No pruritus. No stricture. No external lesions.   Peripheral Vascular Upper Extremity Inspection - Left - Not Gangrenous, No Petechiae. Right - Not Gangrenous, No Petechiae.  Neurologic Neurologic evaluation reveals -normal attention span and ability to concentrate, able to name objects and repeat phrases. Appropriate fund of knowledge and normal coordination.  Neuropsychiatric Mental status exam performed with findings of-able to articulate well with normal speech/language, rate, volume and coherence and no evidence of hallucinations, delusions, obsessions or homicidal/suicidal ideation. Orientation-oriented X3.  Musculoskeletal Global Assessment Gait and Station - normal gait and station.  Lymphatic General Lymphatics Description - No Generalized lymphadenopathy.    Assessment & Plan Adin Hector MD; 01/05/2017 10:04 AM) ADENOMATOUS RECTAL POLYP (D12.8) Impression: Recovering status post excision of prolapsing mass. Pathology consistent with adenomatous polyp.  He is recovering rather well consider never the unit into him.  Consider follow-up colonoscopy in at least 3 years given his risk of new polyp formation. He is pretty sure they wanted to see him in one year to  get a new baseline Current Plans Consider follow up colonoscopy by your gastroenterologist. Since it was a benign pre-cancerous polyp that was removed by colon resection, consider follow-up colonoscopy in about 3 years. Call your gastroenterologist for advice  ANAL POLYP (K62.0) Impression: Anal canal polyp removed. Pathology consistent with fibroepithelial polyp. Current Plans Return to clinic as needed.  Soreness, decreased appetite, and poor energy level are common problems after surgery. While many people can struggle with a bad day, these concerns should gradually fade away or at least improve. Much of your recovery depends on your health & the severity of your operation. Please call if you have any further questions / concerns related to surgery.  Increase activity as tolerated to regular everyday activity. Consider daily low impact exercise every day such as walking an hour a day.  Do not push through pain. If it hurts to do it, then don't do it.  Diet as tolerated. Low fat high fiber diet ideal. 30 g fiber a day ideal. Consider taking a daily fiber supplement to keep your bowels regular.  Followup with your primary care physician for other health issues as would normally be done.  Consider screening for malignancies (breast, prostate, colon, melanoma, etc) as appropriate. Discuss with you primary care physician.   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  At some point he would benefit from surgical repair since they are symptomatic and he is only in his early 24s. We have it scheduled for later this summer. PREOP - [place] - ENCOUNTER FOR PREOPERATIVE EXAMINATION FOR GENERAL SURGICAL PROCEDURE (J88.416) Current Plans The anatomy & physiology of the abdominal wall and pelvic floor was discussed. The pathophysiology  of hernias in the inguinal and pelvic region was discussed. Natural history risks such as progressive enlargement, pain, incarceration, and strangulation was discussed. Contributors to complications such as smoking, obesity, diabetes, prior surgery, etc were discussed.  I feel the risks of no intervention will lead to serious problems that outweigh the operative risks; therefore, I recommended surgery to reduce and repair the hernia. I explained laparoscopic techniques with possible need for an open approach. I noted usual use of  mesh to patch and/or buttress hernia repair  Risks such as bleeding, infection, abscess, need for further treatment, heart attack, death, and other risks were discussed. I noted a good likelihood this will help address the problem. Goals of post-operative recovery were discussed as well. Possibility that this will not correct all symptoms was explained. I stressed the importance of low-impact activity, aggressive pain control, avoiding constipation, & not pushing through pain to minimize risk of post-operative chronic pain or injury. Possibility of reherniation was discussed. We will work to minimize complications.  An educational handout further explaining the pathology & treatment options was given as well. Questions were answered. The patient expresses understanding & wishes to proceed with surgery.  Pt Education - [facility] Hernia Post-Op HCI ([place]): discussed with patient and provided information.  INCARCERATED UMBILICAL HERNIA (V78.4) Impression: At least partially incarcerated periumbilical ventral hernia.  Small Swiss [place] type. At some point would benefit from surgical repair. Given his young age and obesity and activity level, would do mesh underlay repair to minimize recurrence.  PREOP - [place] - ENCOUNTER FOR PREOPERATIVE EXAMINATION FOR GENERAL SURGICAL PROCEDURE (O96.295) Current Plans The anatomy & physiology of the abdominal wall was discussed. The  pathophysiology of hernias was discussed. Natural history risks without surgery including progeressive enlargement, pain, incarceration, & strangulation was discussed. Contributors to complications such as smoking, obesity, diabetes, prior surgery, etc were discussed.  I feel the risks of no intervention will lead to serious problems that outweigh the operative risks; therefore, I recommended surgery to reduce and repair the hernia. I explained laparoscopic techniques with possible need for an open approach. I noted the probable use of mesh to patch and/or buttress the hernia repair  Risks such as bleeding, infection, abscess, need for further treatment, heart attack, death, and other risks were discussed. I noted a good likelihood this will help address the problem. Goals of post-operative recovery were discussed as well. Possibility that this will not correct all symptoms was explained. I stressed the importance of low-impact activity, aggressive pain control, avoiding constipation, & not pushing through pain to minimize risk of post-operative chronic pain or injury. Possibility of reherniation especially with smoking, obesity, diabetes, immunosuppression, and other health conditions was discussed. We will work to minimize complications.  An educational handout further explaining the pathology & treatment options was given as well. Questions were answered. The patient expresses understanding & wishes to proceed with surgery.  Adin Hector, M.D., F.A.C.S. Gastrointestinal and Minimally Invasive Surgery [facility] [address] [place], [place] [address] [phone] Main / Paging

## 2017-03-03 NOTE — Anesthesia Procedure Notes (Addendum)
Procedure Name: Intubation Date/Time: 03/03/2017 8:45 AM Performed by: Dione Booze Pre-anesthesia Checklist: Suction available, Patient being monitored, Emergency Drugs available and Patient identified Patient Re-evaluated:Patient Re-evaluated prior to induction Oxygen Delivery Method: Circle system utilized Preoxygenation: Pre-oxygenation with 100% oxygen Induction Type: IV induction Laryngoscope Size: Mac and 4 Grade View: Grade III Tube type: Oral Tube size: 7.5 mm Number of attempts: 1 Airway Equipment and Method: Stylet Placement Confirmation: ETT inserted through vocal cords under direct vision,  positive ETCO2 and breath sounds checked- equal and bilateral Secured at: 21 cm Tube secured with: Tape Dental Injury: Teeth and Oropharynx as per pre-operative assessment  Difficulty Due To: Difficulty was anticipated, Difficult Airway- due to reduced neck mobility and Difficult Airway- due to large tongue Future Recommendations: Recommend- induction with short-acting agent, and alternative techniques readily available Comments: Visualized cords with cricoid pressure. Glidescope available in room.

## 2017-03-03 NOTE — Op Note (Signed)
03/03/2017  11:21 AM  PATIENT:  Cameron Kemp  56 y.o. male  Patient Care Team: Jani Gravel, MD as PCP - General (Internal Medicine) Michael Boston, MD as Consulting Physician (General Surgery) Carol Ada, MD as Consulting Physician (Gastroenterology)  PRE-OPERATIVE DIAGNOSIS:  Bilateral Inguinal New Hernias, incarcerated umbilical hernia  POST-OPERATIVE DIAGNOSIS:    Bilateral Inguinal New Hernias Incarcerated umbilical hernia  PROCEDURE:   LAPAROSCOPIC EXPLORATION AND REPAIR OF BILATERAL INGUINAL HERNIAS WITH MESH  LAPAROSCOPIC PERIUMBILICAL VENTRAL WALL HERNIA WITH INSERTION OF MESH  SURGEON:  Adin Hector, MD  ASSISTANT: None  ANESTHESIA:     Regional ilioinguinal and genitofemoral and spermatic cord nerve blocks  General  EBL:  Total I/O In: 700 [I.V.:700] Out: 25 [Blood:25].  See anesthesia record  Delay start of Pharmacological VTE agent (>24hrs) due to surgical blood loss or risk of bleeding:  no  DRAINS: NONE  SPECIMEN:  NONE  DISPOSITION OF SPECIMEN:  N/A  COUNTS:  YES  PLAN OF CARE: Discharge to home after PACU  PATIENT DISPOSITION:  PACU - hemodynamically stable.  INDICATION: Pleasant obese male with chronically incarcerated hernia around bellybutton.  Also found to have inguinal hernias as well.  I recommended left scopic exploration and repair of hernias found.  Given his obesity, recommended a mesh underlay reinforcement.  The anatomy & physiology of the abdominal wall and pelvic floor was discussed.  The pathophysiology of hernias in the inguinal and pelvic region was discussed.  Natural history risks such as progressive enlargement, pain, incarceration & strangulation was discussed.   Contributors to complications such as smoking, obesity, diabetes, prior surgery, etc were discussed.    I feel the risks of no intervention will lead to serious problems that outweigh the operative risks; therefore, I recommended surgery to reduce and repair the  hernia.  I explained laparoscopic techniques with possible need for an open approach.  I noted usual use of mesh to patch and/or buttress hernia repair  Risks such as bleeding, infection, abscess, need for further treatment, heart attack, death, and other risks were discussed.  I noted a good likelihood this will help address the problem.   Goals of post-operative recovery were discussed as well.  Possibility that this will not correct all symptoms was explained.  I stressed the importance of low-impact activity, aggressive pain control, avoiding constipation, & not pushing through pain to minimize risk of post-operative chronic pain or injury. Possibility of reherniation was discussed.  We will work to minimize complications.     An educational handout further explaining the pathology & treatment options was given as well.  Questions were answered.  The patient expresses understanding & wishes to proceed with surgery.  OR FINDINGS: Bilateral direct inguinal hernias.  Left greater than right.  Periumbilical ventral hernia 3 x 3 cm incarcerated with some omentum and falciform ligament.  No necrosis.  The bowel or colon involvement.  Type of repair: Laparoscopic underlay repair   Placement of mesh: Intraperitoneal underlay repair  Name of mesh: Bard Ventralight dual sided (polypropylene / Seprafilm)  Size of mesh: 20x15cm  Orientation: Transverse  Mesh overlap:  5-7cm    DESCRIPTION:  The patient was identified & brought into the operating room. The patient was positioned supine with arms tucked. SCDs were active during the entire case. The patient underwent general anesthesia without any difficulty.  The abdomen was prepped and draped in a sterile fashion. The patient's bladder was emptied.  A Surgical Timeout confirmed our plan.  I made  a transverse incision through the inferior umbilical fold.  I made a small transverse nick through the anterior rectus fascia contralateral to the  inguinal hernia side and placed a 0-vicryl stitch through the fascia.  I placed a Hasson trocar into the preperitoneal plane.  Entry was clean.  We induced carbon dioxide insufflation. Camera inspection revealed no injury.  I used a 45mm angled scope to bluntly free the peritoneum off the infraumbilical anterior abdominal wall.  I created enough of a preperitoneal pocket to place 67mm ports into the right & left mid-abdomen into this preperitoneal cavity.  I focused attention on the LEFT pelvis since that was the dominant hernia side.   I used blunt & focused sharp dissection to free the peritoneum off the flank and down to the pubic rim.  I freed the anteriolateral bladder wall off the anteriolateral pelvic wall, sparing midline attachments.   I located a swath of peritoneum going into a hernia fascial defect at the  direct space consistent with  a direct space inguinal hernia..  I gradually freed the peritoneal hernia sac off safely and reduced it into the preperitoneal space.  I freed the peritoneum off the spermatic vessels & vas deferens.  I freed peritoneum off the retroperitoneum along the psoas muscle.   I checked & assured hemostasis.     I turned attention on the opposite  RIGHT pelvis.  I did dissection in a similar, mirror-image fashion. The patient had a direct space inguinal hernia..   I checked & assured hemostasis.    I chose 15x15 cm sheets of ultra-lightweight polypropylene mesh (Ultrapro), one for each side.  I cut a single sigmoid-shaped slit ~6cm from a corner of each mesh.  I placed the meshes into the preperitoneal space & laid them as overlapping diamonds such that at the inferior points, a 6x6 cm corner flap rested in the true anterolateral pelvis, covering the obturator & femoral foramina.   I allowed the bladder to return to the pubis, this helping tuck the corners of the mesh in the anteriolateral pelvis.  The medial corners overlapped each other across midline cephalad to the pubic  rim.   Given the numerous hernias of moderate size, I placed a third 15x15cm mesh in the center as a vertical diamond.  The lateral wings of the mesh overlap across the direct spaces and internal rings where the dominant hernias were.  This provided good coverage and reinforcement of the hernia repairs.  Because of the central mesh placement with good overlap, I only placed a few tacks at the apex paramedian region deep them from falling down.   Next I focused on the perivesical ventral hernia.  I redirected the right paramedian port into the peritoneal cavity.  I redirected and place ports along the left flank into the peritoneal cavity.  I could see the hernia on the prietal peritoneum under the abdominal wall.  Some omentum and falciform ligament associate with it.  I lap scopic freed the falciform ligament off the preperitoneal space and followed that down to the inferior umbilical ligament.  I connected with my preperitoneal dissection.  Reduced and removed the fat from the peri-medical ventral hernia.  This allowed exposure of the entire anterior abdominal wall.  I primarily used focused sharp dissection.    I made sure hemostasis was good.  I mapped out the region using a needle passer.   To ensure that I would have at least 5 cm radial coverage outside of the hernia  defect, I chose a 20x15cm dual sided mesh.  I placed #1 Prolene stitches around its edge about every 5 cm = 12 total.  I rolled the mesh & placed into the peritoneal cavity through the hernia defect.  I unrolled the mesh and positioned it appropriately.  I secured the mesh to cover up the hernia defect using a laparoscopic suture passer to pass the tails of the Prolene through the abdominal wall & tagged them with clamps for good transfascial suturing.  I started out in four corners to make sure I had the mesh centered under the hernia defect appropriately, and then proceeded to work in quadrants.    We evacuated CO2 & desufflated the  abdomen.  I tied the fascial stitches down. I closed the fascial defect that I placed the mesh through using #1 PDS interrupted transverse stitches primarily.  I reinsufflated the abdomen. The mesh provided at least circumferential coverage around the entire region of hernia defects.  I secured the mesh centrally with an additional trans fascial stitch in & out the mesh using #1 PDS under laparoscopic visualization.   I used that stitch to help re-tacked the inferior peritoneal flap to cover the lower half of the periumbilical mesh and all of the inguinal mesh.  I tacked the edges of the peritoneum as well as the edges of the mesh.  I tacked the edges & central part of the mesh to the peritoneum/posterior rectus fascia with SecureStrap absorbable tacks.   I did reinspection. Hemostasis was good. Mesh laid well. I completed a broad field block of local anesthesia at fascial stitch sites & fascial closure areas.    Capnoperitoneum was evacuated. Ports were removed. The skin was closed with Monocryl at the port sites and Steri-Strips on the fascial stitch puncture sites.  Patient is being extubated to go to the recovery room.  I discussed operative findings, updated the patient's status, discussed probable steps to recovery, and gave postoperative recommendations to the patient's spouse.  Recommendations were made.  Questions were answered.  She expressed understanding & appreciation.  Adin Hector, M.D., F.A.C.S. Gastrointestinal and Minimally Invasive Surgery [facility] [address], [place], [place] [address] [phone] Main / Paging  03/03/2017 11:21 AM

## 2017-03-03 NOTE — Transfer of Care (Signed)
Immediate Anesthesia Transfer of Care Note  Patient: Cameron Kemp  Procedure(s) Performed: Procedure(s): LAPAROSCOPIC EXPLORATION AND REPAIR OF BILATERAL INGUINAL HERNIAS WITH MESH  (Bilateral) LAPAROSCOPIC VENTRAL WALL HERNIA (N/A) INSERTION OF MESH (N/A)  Patient Location: PACU  Anesthesia Type:General  Level of Consciousness: awake, alert  and patient cooperative  Airway & Oxygen Therapy: Patient Spontanous Breathing and Patient connected to face mask oxygen  Post-op Assessment: Report given to RN and Post -op Vital signs reviewed and stable  Post vital signs: Reviewed and stable  Last Vitals:  Vitals:   03/03/17 0628  BP: 119/74  Pulse: (!) 58  Resp: 18  Temp: 36.7 C  SpO2: 97%    Last Pain:  Vitals:   03/03/17 0644  TempSrc:   PainSc: 0-No pain      Patients Stated Pain Goal: 3 (13/88/71 9597)  Complications: No apparent anesthesia complications

## 2017-03-03 NOTE — Interval H&P Note (Signed)
History and Physical Interval Note:  03/03/2017 8:32 AM  Cameron Kemp  has presented today for surgery, with the diagnosis of Bilateral Inguinal New Hernias, incarcerated umbilical hernia  The various methods of treatment have been discussed with the patient and family. After consideration of risks, benefits and other options for treatment, the patient has consented to  Procedure(s): [place] (Bilateral) LAPAROSCOPIC VENTRAL WALL HERNIA (N/A) as a surgical intervention .  The patient's history has been reviewed, patient examined, no change in status, stable for surgery.  I have reviewed the patient's chart and labs.  Questions were answered to the patient's satisfaction.     [name] C.

## 2017-03-03 NOTE — Anesthesia Postprocedure Evaluation (Signed)
Anesthesia Post Note  Patient: [name]  Procedure(s) Performed: Procedure(s) (LRB): LAPAROSCOPIC EXPLORATION AND REPAIR OF BILATERAL INGUINAL HERNIAS WITH MESH  (Bilateral) LAPAROSCOPIC VENTRAL WALL HERNIA (N/A) INSERTION OF MESH (N/A)     Patient location during evaluation: PACU Anesthesia Type: General Level of consciousness: awake and alert Pain management: pain level controlled Vital Signs Assessment: post-procedure vital signs reviewed and stable Respiratory status: spontaneous breathing, nonlabored ventilation and respiratory function stable Cardiovascular status: blood pressure returned to baseline and stable Postop Assessment: no signs of nausea or vomiting Anesthetic complications: no    Last Vitals:  Vitals:   03/03/17 1145 03/03/17 1200  BP: (!) 149/95 (!) 151/88  Pulse: 69 67  Resp: 15 16  Temp: 36.9 C 36.8 C  SpO2: 94% 94%    Last Pain:  Vitals:   03/03/17 1145  TempSrc:   PainSc: 3                  Sonam Wandel,W. EDMOND

## 2017-03-03 NOTE — Discharge Instructions (Signed)
HERNIA REPAIR: POST OP INSTRUCTIONS ° °###################################################################### ° °EAT °Gradually transition to a high fiber diet with a fiber supplement over the next few weeks after discharge.  Start with a pureed / full liquid diet (see below) ° °WALK °Walk an hour a day.  Control your pain to do that.   ° °CONTROL PAIN °Control pain so that you can walk, sleep, tolerate sneezing/coughing, go up/down stairs. ° °HAVE A BOWEL MOVEMENT DAILY °Keep your bowels regular to avoid problems.  OK to try a laxative to override constipation.  OK to use an antidairrheal to slow down diarrhea.  Call if not better after 2 tries ° °CALL IF YOU HAVE PROBLEMS/CONCERNS °Call if you are still struggling despite following these instructions. °Call if you have concerns not answered by these instructions ° °###################################################################### ° ° ° °1. DIET: Follow a light bland diet the first 24 hours after arrival home, such as soup, liquids, crackers, etc.  Be sure to include lots of fluids daily.  Avoid fast food or heavy meals as your are more likely to get nauseated.  Eat a low fat the next few days after surgery. °2. Take your usually prescribed home medications unless otherwise directed. °3. PAIN CONTROL: °a. Pain is best controlled by a usual combination of three different methods TOGETHER: °i. Ice/Heat °ii. Over the counter pain medication °iii. Prescription pain medication °b. Most patients will experience some swelling and bruising around the hernia(s) such as the bellybutton, groins, or old incisions.  Ice packs or heating pads (30-60 minutes up to 6 times a day) will help. Use ice for the first few days to help decrease swelling and bruising, then switch to heat to help relax tight/sore spots and speed recovery.  Some people prefer to use ice alone, heat alone, alternating between ice & heat.  Experiment to what works for you.  Swelling and bruising can take  several weeks to resolve.   °c. It is helpful to take an over-the-counter pain medication regularly for the first few weeks.  Choose one of the following that works best for you: °i. Naproxen (Aleve, etc)  Two 220mg tabs twice a day °ii. Ibuprofen (Advil, etc) Three 200mg tabs four times a day (every meal & bedtime) °iii. Acetaminophen (Tylenol, etc) 325-650mg four times a day (every meal & bedtime) °d. A  prescription for pain medication should be given to you upon discharge.  Take your pain medication as prescribed.  °i. If you are having problems/concerns with the prescription medicine (does not control pain, nausea, vomiting, rash, itching, etc), please call us [phone] to see if we need to switch you to a different pain medicine that will work better for you and/or control your side effect better. °ii. If you need a refill on your pain medication, please contact your pharmacy.  They will contact our office to request authorization. Prescriptions will not be filled after 5 pm or on week-ends. °4. Avoid getting constipated.  Between the surgery and the pain medications, it is common to experience some constipation.  Increasing fluid intake and taking a fiber supplement (such as Metamucil, Citrucel, FiberCon, MiraLax, etc) 1-2 times a day regularly will usually help prevent this problem from occurring.  A mild laxative (prune juice, Milk of Magnesia, MiraLax, etc) should be taken according to package directions if there are no bowel movements after 48 hours.   °5. Wash / shower every day.  You may shower over the dressings as they are waterproof.   °6. Remove   your waterproof bandages 5 days after surgery.  You may leave the incision open to air.  You may replace a dressing/Band-Aid to cover the incision for comfort if you wish.  Continue to shower over incision(s) after the dressing is off.    7. ACTIVITIES as tolerated:   a. You may resume regular (light) daily activities beginning the next day--such  as daily self-care, walking, climbing stairs--gradually increasing activities as tolerated.  If you can walk 30 minutes without difficulty, it is safe to try more intense activity such as jogging, treadmill, bicycling, low-impact aerobics, swimming, etc. b. Save the most intensive and strenuous activity for last such as sit-ups, heavy lifting, contact sports, etc  Refrain from any heavy lifting or straining until you are off narcotics for pain control.   c. DO NOT PUSH THROUGH PAIN.  Let pain be your guide: If it hurts to do something, don't do it.  Pain is your body warning you to avoid that activity for another week until the pain goes down. d. You may drive when you are no longer taking prescription pain medication, you can comfortably wear a seatbelt, and you can safely maneuver your car and apply brakes. e. Dennis Bast may have sexual intercourse when it is comfortable.  8. FOLLOW UP in our office a. Please call [facility] at [phone] to set up an appointment to see your surgeon in the office for a follow-up appointment approximately 2-3 weeks after your surgery. b. Make sure that you call for this appointment the day you arrive home to insure a convenient appointment time. 9.  IF YOU HAVE DISABILITY OR FAMILY LEAVE FORMS, BRING THEM TO THE OFFICE FOR PROCESSING.  DO NOT GIVE THEM TO YOUR DOCTOR.  WHEN TO CALL us [phone]: 1. Poor pain control 2. Reactions / problems with new medications (rash/itching, nausea, etc)  3. Fever over 101.5 F (38.5 C) 4. Inability to urinate 5. Nausea and/or vomiting 6. Worsening swelling or bruising 7. Continued bleeding from incision. 8. Increased pain, redness, or drainage from the incision   The clinic staff is available to answer your questions during regular business hours (8:30am-5pm).  Please dont hesitate to call and ask to speak to one of our nurses for clinical concerns.   If you have a medical emergency, go to the nearest emergency room or call  911.  A surgeon from [facility] is always on call at the hospitals in [facility], [place], [place], [place], [place]  [address] ?  [address] [address], [place], [place]   [address] MAIN: [phone] ? TOLL FREE: [phone] ? FAX: [phone] [email or web address]   General Anesthesia, Adult, Care After These instructions provide you with information about caring for yourself after your procedure. Your health care provider may also give you more specific instructions. Your treatment has been planned according to current medical practices, but problems sometimes occur. Call your health care provider if you have any problems or questions after your procedure. What can I expect after the procedure? After the procedure, it is common to have:  Vomiting.  A sore throat.  Mental slowness.  It is common to feel:  Nauseous.  Cold or shivery.  Sleepy.  Tired.  Sore or achy, even in parts of your body where you did not have surgery.  Follow these instructions at home: For at least 24 hours after the procedure:  Do not: ? Participate in activities where you could fall or become  injured. ? Drive. ? Use heavy machinery. ? Drink alcohol. ? Take sleeping pills or medicines that cause drowsiness. ? Make important decisions or sign legal documents. ? Take care of children on your own.  Rest. Eating and drinking  If you vomit, drink water, juice, or soup when you can drink without vomiting.  Drink enough fluid to keep your urine clear or pale yellow.  Make sure you have little or no nausea before eating solid foods.  Follow the diet recommended by your health care provider. General instructions  Have a responsible adult stay with you until you are awake and alert.  Return to your normal activities as told by your health care provider. Ask your health care provider what activities are safe for you.  Take  over-the-counter and prescription medicines only as told by your health care provider.  If you smoke, do not smoke without supervision.  Keep all follow-up visits as told by your health care provider. This is important. Contact a health care provider if:  You continue to have nausea or vomiting at home, and medicines are not helpful.  You cannot drink fluids or start eating again.  You cannot urinate after 8-12 hours.  You develop a skin rash.  You have fever.  You have increasing redness at the site of your procedure. Get help right away if:  You have difficulty breathing.  You have chest pain.  You have unexpected bleeding.  You feel that you are having a life-threatening or urgent problem. This information is not intended to replace advice given to you by your health care provider. Make sure you discuss any questions you have with your health care provider. Document Released: 10/05/2000 Document Revised: 12/02/2015 Document Reviewed: 06/13/2015 Elsevier Interactive Patient Education  [employer].

## 2017-05-29 IMAGING — DX DG CHEST 2V
2 series · 2 of 2 positions shown · non-contrast
Comparison: 07/19/2015 and prior exams

CLINICAL DATA: 54-year-old male with left chest pain for 2 weeks.

EXAM:
CHEST  2 VIEW

[chest pa]
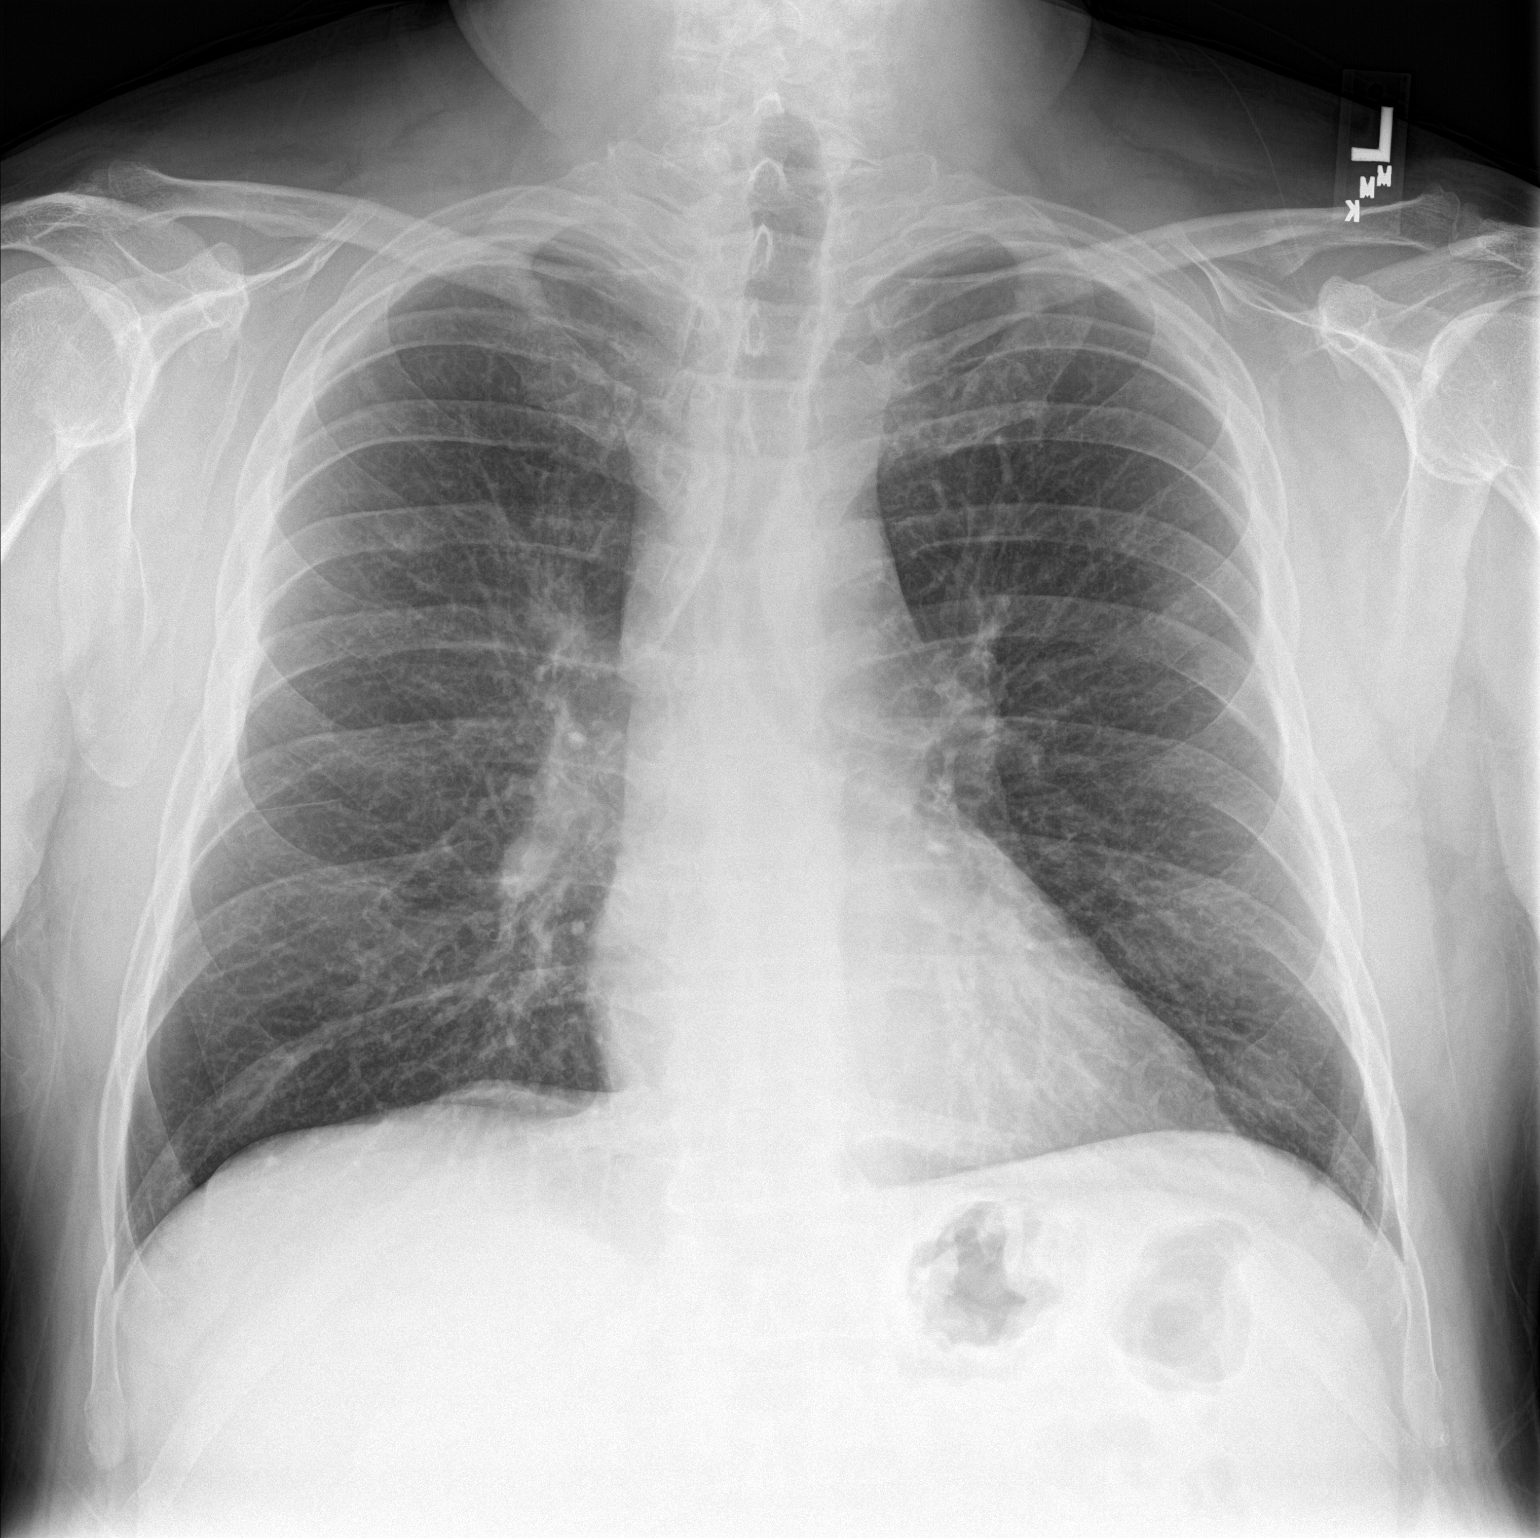

[chest lat]
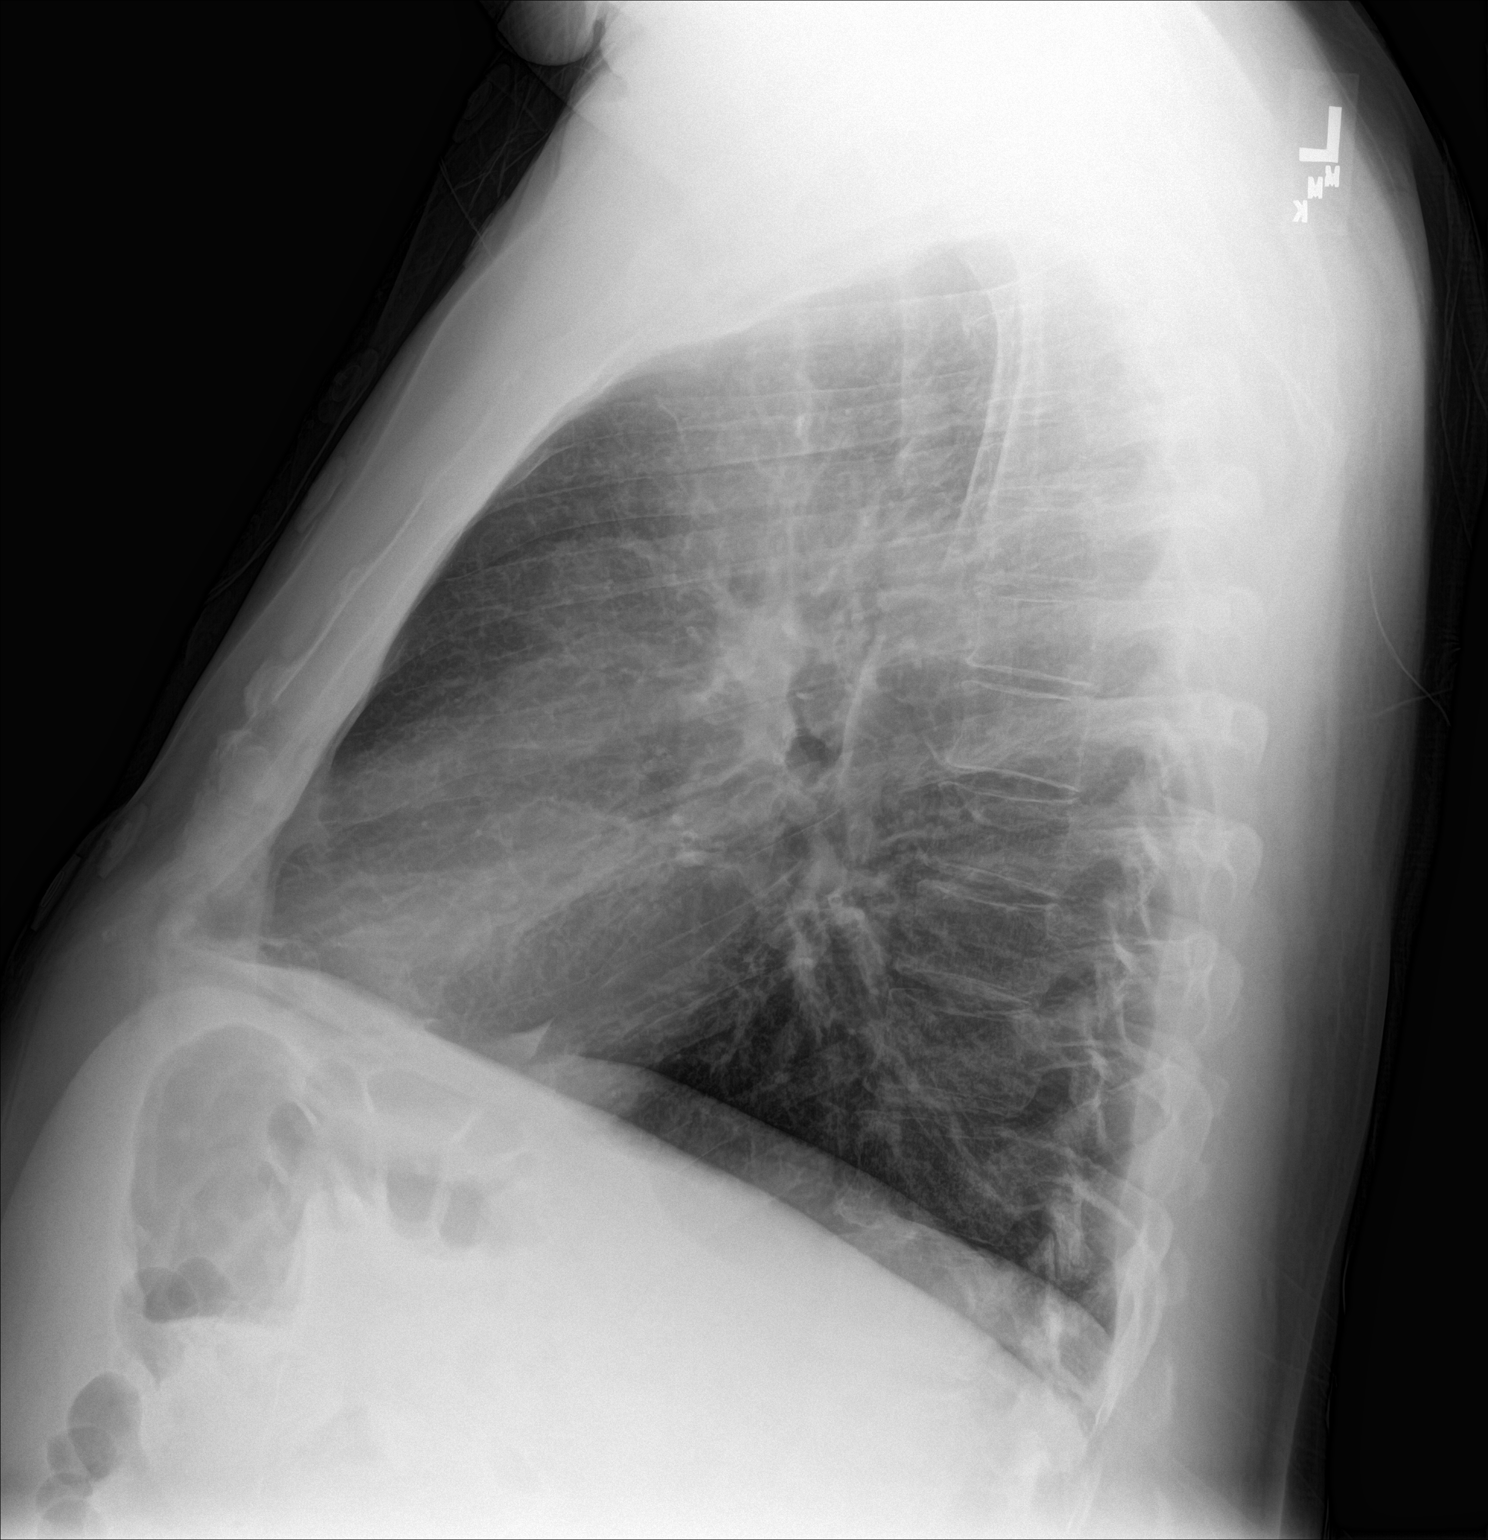

[2 of 2 positions shown; findings below may reference images not displayed]

FINDINGS: The cardiomediastinal silhouette is unremarkable.

COPD/ emphysema changes identified.

There is no evidence of focal airspace disease, pulmonary edema,
suspicious pulmonary nodule/mass, pleural effusion, or pneumothorax.
No acute bony abnormalities are identified.
IMPRESSION: COPD/emphysema without evidence of acute cardiopulmonary disease.

## 2019-09-12 ENCOUNTER — Other Ambulatory Visit: Payer: Self-pay

## 2019-09-12 ENCOUNTER — Emergency Department (HOSPITAL_COMMUNITY)
Admission: EM | Admit: 2019-09-12 | Discharge: 2019-09-12 | Disposition: A | Discharge status: Home / Self Care | Payer: No Typology Code available for payment source | Attending: Emergency Medicine | Admitting: Emergency Medicine

## 2019-09-12 ENCOUNTER — Emergency Department (HOSPITAL_COMMUNITY): Payer: No Typology Code available for payment source

## 2019-09-12 ENCOUNTER — Encounter (HOSPITAL_COMMUNITY): Payer: Self-pay | Admitting: Emergency Medicine

## 2019-09-12 DIAGNOSIS — Z79899 Other long term (current) drug therapy: Secondary | ICD-10-CM | POA: Insufficient documentation

## 2019-09-12 DIAGNOSIS — R1084 Generalized abdominal pain: Secondary | ICD-10-CM | POA: Insufficient documentation

## 2019-09-12 DIAGNOSIS — R519 Headache, unspecified: Secondary | ICD-10-CM | POA: Diagnosis not present

## 2019-09-12 DIAGNOSIS — R739 Hyperglycemia, unspecified: Secondary | ICD-10-CM | POA: Insufficient documentation

## 2019-09-12 DIAGNOSIS — I10 Essential (primary) hypertension: Secondary | ICD-10-CM | POA: Insufficient documentation

## 2019-09-12 DIAGNOSIS — Z5320 Procedure and treatment not carried out because of patient's decision for unspecified reasons: Secondary | ICD-10-CM | POA: Insufficient documentation

## 2019-09-12 DIAGNOSIS — R200 Anesthesia of skin: Secondary | ICD-10-CM | POA: Diagnosis not present

## 2019-09-12 DIAGNOSIS — G8929 Other chronic pain: Secondary | ICD-10-CM | POA: Diagnosis not present

## 2019-09-12 DIAGNOSIS — Z87891 Personal history of nicotine dependence: Secondary | ICD-10-CM | POA: Diagnosis not present

## 2019-09-12 LAB — CBC
HCT: 41.8 % (ref 39.0–52.0)
Hemoglobin: 14.8 g/dL (ref 13.0–17.0)
MCH: 34.4 pg — ABNORMAL HIGH (ref 26.0–34.0)
MCHC: 35.4 g/dL (ref 30.0–36.0)
MCV: 97.2 fL (ref 80.0–100.0)
Platelets: 144 10*3/uL — ABNORMAL LOW (ref 150–400)
RBC: 4.3 MIL/uL (ref 4.22–5.81)
RDW: 11.7 % (ref 11.5–15.5)
WBC: 4.8 10*3/uL (ref 4.0–10.5)
nRBC: 0 % (ref 0.0–0.2)

## 2019-09-12 LAB — BASIC METABOLIC PANEL
Anion gap: 8 (ref 5–15)
BUN: 16 mg/dL (ref 6–20)
CO2: 28 mmol/L (ref 22–32)
Calcium: 8.5 mg/dL — ABNORMAL LOW (ref 8.9–10.3)
Chloride: 102 mmol/L (ref 98–111)
Creatinine, Ser: 0.85 mg/dL (ref 0.61–1.24)
GFR calc Af Amer: >60 mL/min (ref >60)
GFR calc non Af Amer: >60 mL/min (ref >60)
Glucose, Bld: 156 mg/dL — ABNORMAL HIGH (ref 70–99)
Potassium: 3.2 mmol/L — ABNORMAL LOW (ref 3.5–5.1)
Sodium: 138 mmol/L (ref 135–145)

## 2019-09-12 LAB — TROPONIN I (HIGH SENSITIVITY): Troponin I (High Sensitivity): 2 ng/L (ref <18)

## 2019-09-12 NOTE — ED Provider Notes (Signed)
[facility] DEPT Provider Note   CSN: [id number] Arrival date & time: 09/12/19  1239     History Chief Complaint  Patient presents with  . arm numbness    Cameron Kemp is a 58 y.o. male with past medical history significant for arthritis, anxiety, hypertension, obesity who presents for evaluation of really not feeling well.  Patient states he was fine this morning when he went to eat lunch around 12:00.  Patient states he has some mid abdominal pain which did not radiate.  Patient also states he had a approximately 5-minute episode of right upper extremity numbness which went from his elbow to his hand.  States he has had similar symptoms in the past.  Patient states at that time he felt like he was going to vomit however was not able to.  Dates he has a chronic headache due to chronic nasal congestion.  He denies any sudden onset thunderclap headache, facial droop, lateral weakness.  He denies any chest pain, shortness of breath, hemoptysis, lateral leg swelling, redness, warmth.  Denies any prior history of known AAA.  He has not taken anything for his symptoms.  Has additional aggravating or alleviating factors.  History obtained from patient and past medical records.  No interpreter is used.  HPI     Past Medical History:  Diagnosis Date  . Anxiety   . Arthritis    hands, wrist, elbows  . At risk for sleep apnea    STOP-BANG= 5           SENT TO PCP 10-23-2016  . Hemorrhoids    symptomatic  . History of gastroesophageal reflux (GERD)   . Hypertension   . Mass of perianal area    prolapsed  . Wears glasses     Patient Active Problem List   Diagnosis Date Noted  . Obesity (BMI 30-39.9) 03/03/2017  . Polyp of anal verge, prolapsing 10/28/2016  . Bilateral inguinal herniae s/p lap repair w mesh 03/03/2017 10/28/2016  . Umbilical hernia, incarcerated, s/p lap repair with mesh 03/03/2017 10/28/2016    Past Surgical History:  Procedure  Laterality Date  . COLONOSCOPY  08/2016  . EVALUATION UNDER ANESTHESIA WITH HEMORRHOIDECTOMY N/A 10/28/2016   Procedure: EXAM UNDER ANESTHESIA WITH HEMORRHOIDECTOMY HEMORRHOIDAL LIGATION PEXY;  Surgeon: Michael Boston, MD;  Location: [facility];  Service: General;  Laterality: N/A;  . INGUINAL HERNIA REPAIR Bilateral 03/03/2017   Procedure: LAPAROSCOPIC EXPLORATION AND REPAIR OF BILATERAL INGUINAL HERNIAS WITH MESH ;  Surgeon: Michael Boston, MD;  Location: [facility];  Service: General;  Laterality: Bilateral;  . INSERTION OF MESH N/A 03/03/2017   Procedure: INSERTION OF MESH;  Surgeon: Michael Boston, MD;  Location: [facility];  Service: General;  Laterality: N/A;  . MASS EXCISION N/A 10/28/2016   Procedure: EXCISION ANAL CANAL MASS;  Surgeon: Michael Boston, MD;  Location: [facility];  Service: General;  Laterality: N/A;  . TONSILLECTOMY  age 56  . VENTRAL HERNIA REPAIR N/A 03/03/2017   Procedure: LAPAROSCOPIC VENTRAL WALL HERNIA;  Surgeon: Michael Boston, MD;  Location: [facility];  Service: General;  Laterality: N/A;       Family History  Problem Relation Age of Onset  . Hyperlipidemia Brother   . Hypertension Brother   . Stroke Paternal Grandmother   . Hypertension Brother     Social History   Tobacco Use  . Smoking status: Former Smoker    Packs/day: 1.00    Years: 25.00  Pack years: 25.00    Types: Cigarettes    Quit date: 07/25/2014    Years since quitting: 5.1  . Smokeless tobacco: Never Used  Substance Use Topics  . Alcohol use: Yes    Alcohol/week: 10.0 standard drinks    Types: 10 Cans of beer per week    Comment: beers 2 x per week   . Drug use: No    Home Medications Prior to Admission medications   Medication Sig Start Date End Date Taking? Authorizing Provider  doxycycline (VIBRAMYCIN) 100 MG capsule Take 100 mg by mouth 2 (two) times daily.    [provider]  hydrochlorothiazide (HYDRODIURIL) 25 MG tablet Take 25 mg by mouth every  morning.    [provider]  LORazepam (ATIVAN) 0.5 MG tablet Take 0.5 mg by mouth at bedtime as needed for sleep.     [provider]  methocarbamol (ROBAXIN) 750 MG tablet Take 1 tablet (750 mg total) by mouth 4 (four) times daily as needed (use for muscle cramps/pain). 03/03/17   Michael Boston, MD  metoprolol tartrate (LOPRESSOR) 25 MG tablet Take 25 mg by mouth 2 (two) times daily.      [provider]  naproxen (NAPROSYN) 500 MG tablet Take 1 tablet (500 mg total) by mouth 2 (two) times daily with a meal. 03/03/17   Michael Boston, MD  oxyCODONE (OXY IR/ROXICODONE) 5 MG immediate release tablet Take 1-2 tablets (5-10 mg total) by mouth every 4 (four) hours as needed for moderate pain or severe pain. 03/03/17   Michael Boston, MD  predniSONE (STERAPRED UNI-PAK 21 TAB) 10 MG (21) TBPK tablet Take by mouth daily.    [provider]  Psyllium (SM FIBER POWDER PO) Take 1 scoop by mouth daily. Mixed with water    [provider]    Allergies    Penicillins  Review of Systems   Review of Systems  Constitutional: Negative.   HENT: Negative.   Respiratory: Negative.   Cardiovascular: Negative.   Gastrointestinal: Positive for abdominal pain and nausea. Negative for abdominal distention, anal bleeding, blood in stool, constipation, diarrhea, rectal pain and vomiting.  Genitourinary: Negative.   Musculoskeletal: Negative.   Skin: Negative.   Neurological: Positive for numbness (5 minute episode) and headaches (Chronic). Negative for dizziness, tremors, seizures, syncope, facial asymmetry, speech difficulty, weakness and light-headedness.  All other systems reviewed and are negative.  Physical Exam Updated Vital Signs BP (!) 166/110 (BP Location: Left Arm)   Pulse 83   Temp 97.9 F (36.6 C) (Oral)   Resp 19   SpO2 96%   Physical Exam  Physical Exam  Constitutional: Pt is oriented to person, place, and time. Pt appears well-developed and  well-nourished. No distress.  HENT:  Head: Normocephalic and atraumatic.  Mouth/Throat: Oropharynx is clear and moist.  Eyes: Conjunctivae and EOM are normal. Pupils are equal, round, and reactive to light. No scleral icterus.  No horizontal, vertical or rotational nystagmus  Neck: Normal range of motion. Neck supple.  Full active and passive ROM without pain No midline or paraspinal tenderness No nuchal rigidity or meningeal signs  Cardiovascular: Normal rate, regular rhythm and intact distal pulses.   Pulmonary/Chest: Effort normal and breath sounds normal. No respiratory distress. Pt has no wheezes. No rales.  Abdominal: Soft. Bowel sounds are normal. There is no tenderness. There is no rebound and no guarding.  Np pulsatile abdominal mass. Musculoskeletal: Normal range of motion.  Lymphadenopathy:    No cervical adenopathy.  Neurological: Pt. is alert and oriented to person, place, and time. He has normal reflexes. No cranial nerve deficit.  Exhibits normal muscle tone. Coordination normal.  Mental Status:  Alert, oriented, thought content appropriate. Speech fluent without evidence of aphasia. Able to follow 2 step commands without difficulty.  Cranial Nerves:  II:  Peripheral visual fields grossly normal, pupils equal, round, reactive to light III,IV, VI: ptosis not present, extra-ocular motions intact bilaterally  V,VII: smile symmetric, facial light touch sensation equal VIII: hearing grossly normal bilaterally  IX,X: midline uvula rise  XI: bilateral shoulder shrug equal and strong XII: midline tongue extension  Motor:  5/5 in upper and lower extremities bilaterally including strong and equal grip strength and dorsiflexion/plantar flexion Sensory: Pinprick and light touch normal in all extremities.  Deep Tendon Reflexes: 2+ and symmetric  Cerebellar: normal finger-to-nose with bilateral upper extremities Gait: normal gait and balance CV: distal pulses palpable throughout     Skin: Skin is warm and dry. No rash noted. Pt is not diaphoretic.  Psychiatric: Pt has a normal mood and affect. Behavior is normal. Judgment and thought content normal.  Nursing note and vitals reviewed.  ED Results / Procedures / Treatments   Labs (all labs ordered are listed, but only abnormal results are displayed) Labs Reviewed  BASIC METABOLIC PANEL - Abnormal; Notable for the following components:      Result Value   Potassium 3.2 (*)    Glucose, Bld 156 (*)    Calcium 8.5 (*)    All other components within normal limits  CBC - Abnormal; Notable for the following components:   MCH 34.4 (*)    Platelets 144 (*)    All other components within normal limits  TROPONIN I (HIGH SENSITIVITY)  TROPONIN I (HIGH SENSITIVITY)    EKG None  Radiology DG Chest 2 View  Result Date: 09/12/2019 CLINICAL DATA:  Arm numbness. EXAM: CHEST - 2 VIEW COMPARISON:  05/26/2019. FINDINGS: Mediastinum hilar structures normal. Heart size normal. No focal infiltrate. No pleural effusion or pneumothorax. IMPRESSION: No acute cardiopulmonary disease. Electronically Signed   By: Marcello Moores  Register   On: 09/12/2019 13:19    Procedures Procedures (including critical care time)  Medications Ordered in ED Medications - No data to display  ED Course  I have reviewed the triage vital signs and the nursing notes.  Pertinent labs & imaging results that were available during my care of the patient were reviewed by me and considered in my medical decision making (see chart for details).  On entering the room patient states he wants to leave the ED. States his symptoms have "resolved."  He is ambulating around room without difficulty.  Patient has no complaints.  Discussed with patient's his symptoms were concerning given he had numbness to his right upper extremity.  Patient declined any imaging here today or further work-up in the emergency department.  He did have labs obtained from triage.  Initial troponin  negative, metabolic panel with mild hypokalemia, hyperglycemia, CBC without leukocytosis. EKG without STEMI.  Chest x-ray without cardiomegaly, pulmonary edema, pneumothorax, infiltrates.  Again discussed with patient I recommend further work-up including repeat troponins, additional imaging to rule out possible cardiac, vascular, intracranial process of his symptoms.  Patient has chosen to leave [place].  I discussed with patient to return for any new or worsening symptoms or if he decides he wants further work-up.  We discussed the nature and purpose, risks and benefits, as well as, the alternatives of  treatment. Time was given to allow the opportunity to ask questions and consider their options, and after the discussion, the patient decided to refuse the offerred treatment. The patient was informed that refusal could lead to, but was not limited to, death, permanent disability, or severe pain. If present, I asked the relatives or significant others to dissuade them without success. Prior to refusing, I determined that the patient had the capacity to make their decision and understood the consequences of that decision. After refusal, I made every reasonable opportunity to treat them to the best of my ability.  The patient was notified that they may return to the emergency department at any time for further treatment.       MDM Rules/Calculators/A&P                       Final Clinical Impression(s) / ED Diagnoses Final diagnoses:  Numbness  Hyperglycemia  Generalized abdominal pain    Rx / DC Orders ED Discharge Orders    None       Homar Weinkauf A, PA-C 09/12/19 1448    Blanchie Dessert, MD 09/13/19 [id number]

## 2019-09-12 NOTE — Discharge Instructions (Signed)
You are leaving today AGAINST MEDICAL ADVICE.  If you have any new or worsening symptoms I encourage you to return to the emergency department for further testing.

## 2019-09-12 NOTE — ED Triage Notes (Signed)
Per patient, states he was at work eating lunch when he started having mid abdominal discomfort and right arm numbness-felt "gaseous"

## 2021-07-05 IMAGING — CR DG CHEST 2V
2 series · 2 of 2 positions shown · non-contrast
Comparison: 05/26/2019.

CLINICAL DATA: Arm numbness.

EXAM:
CHEST - 2 VIEW

[w chest pa]
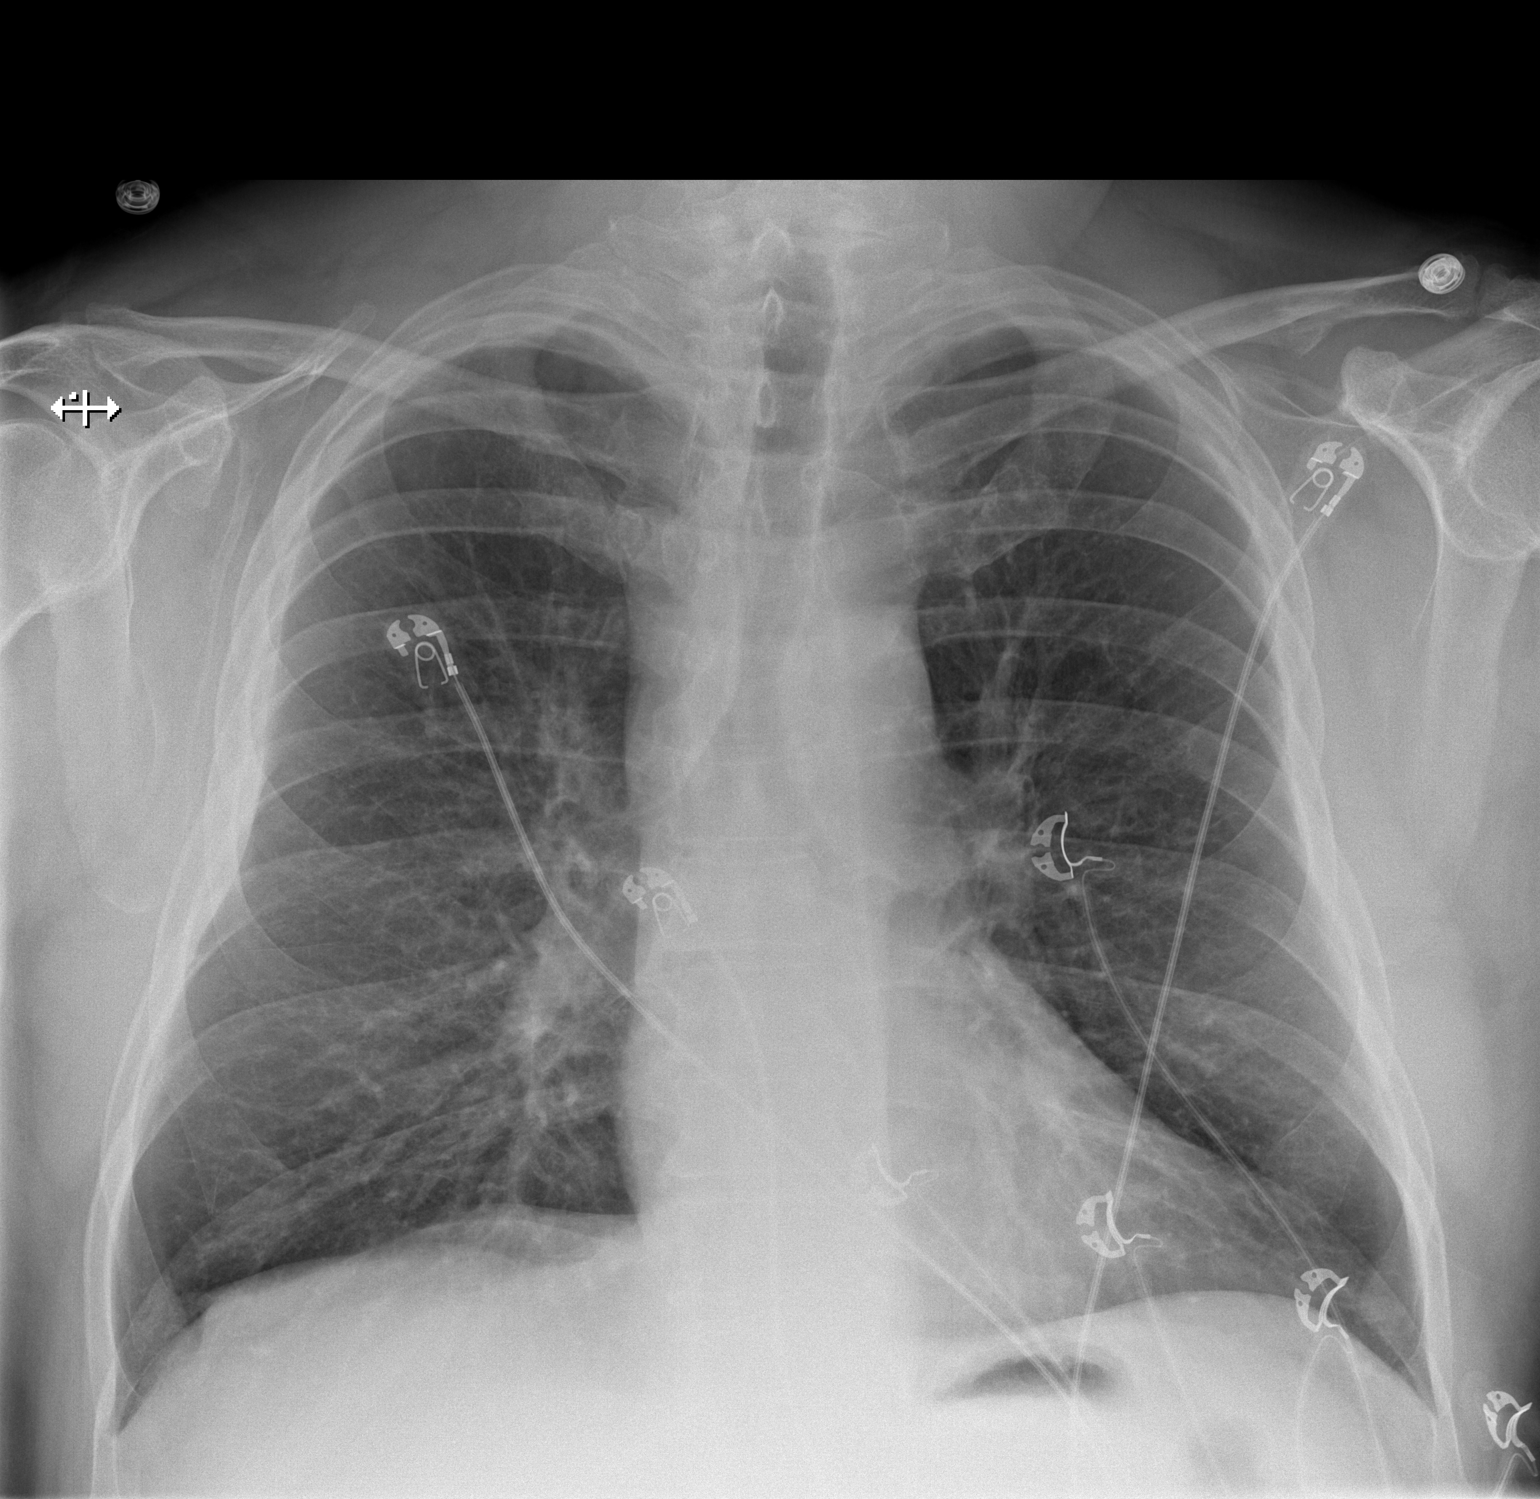

[w chest lat]
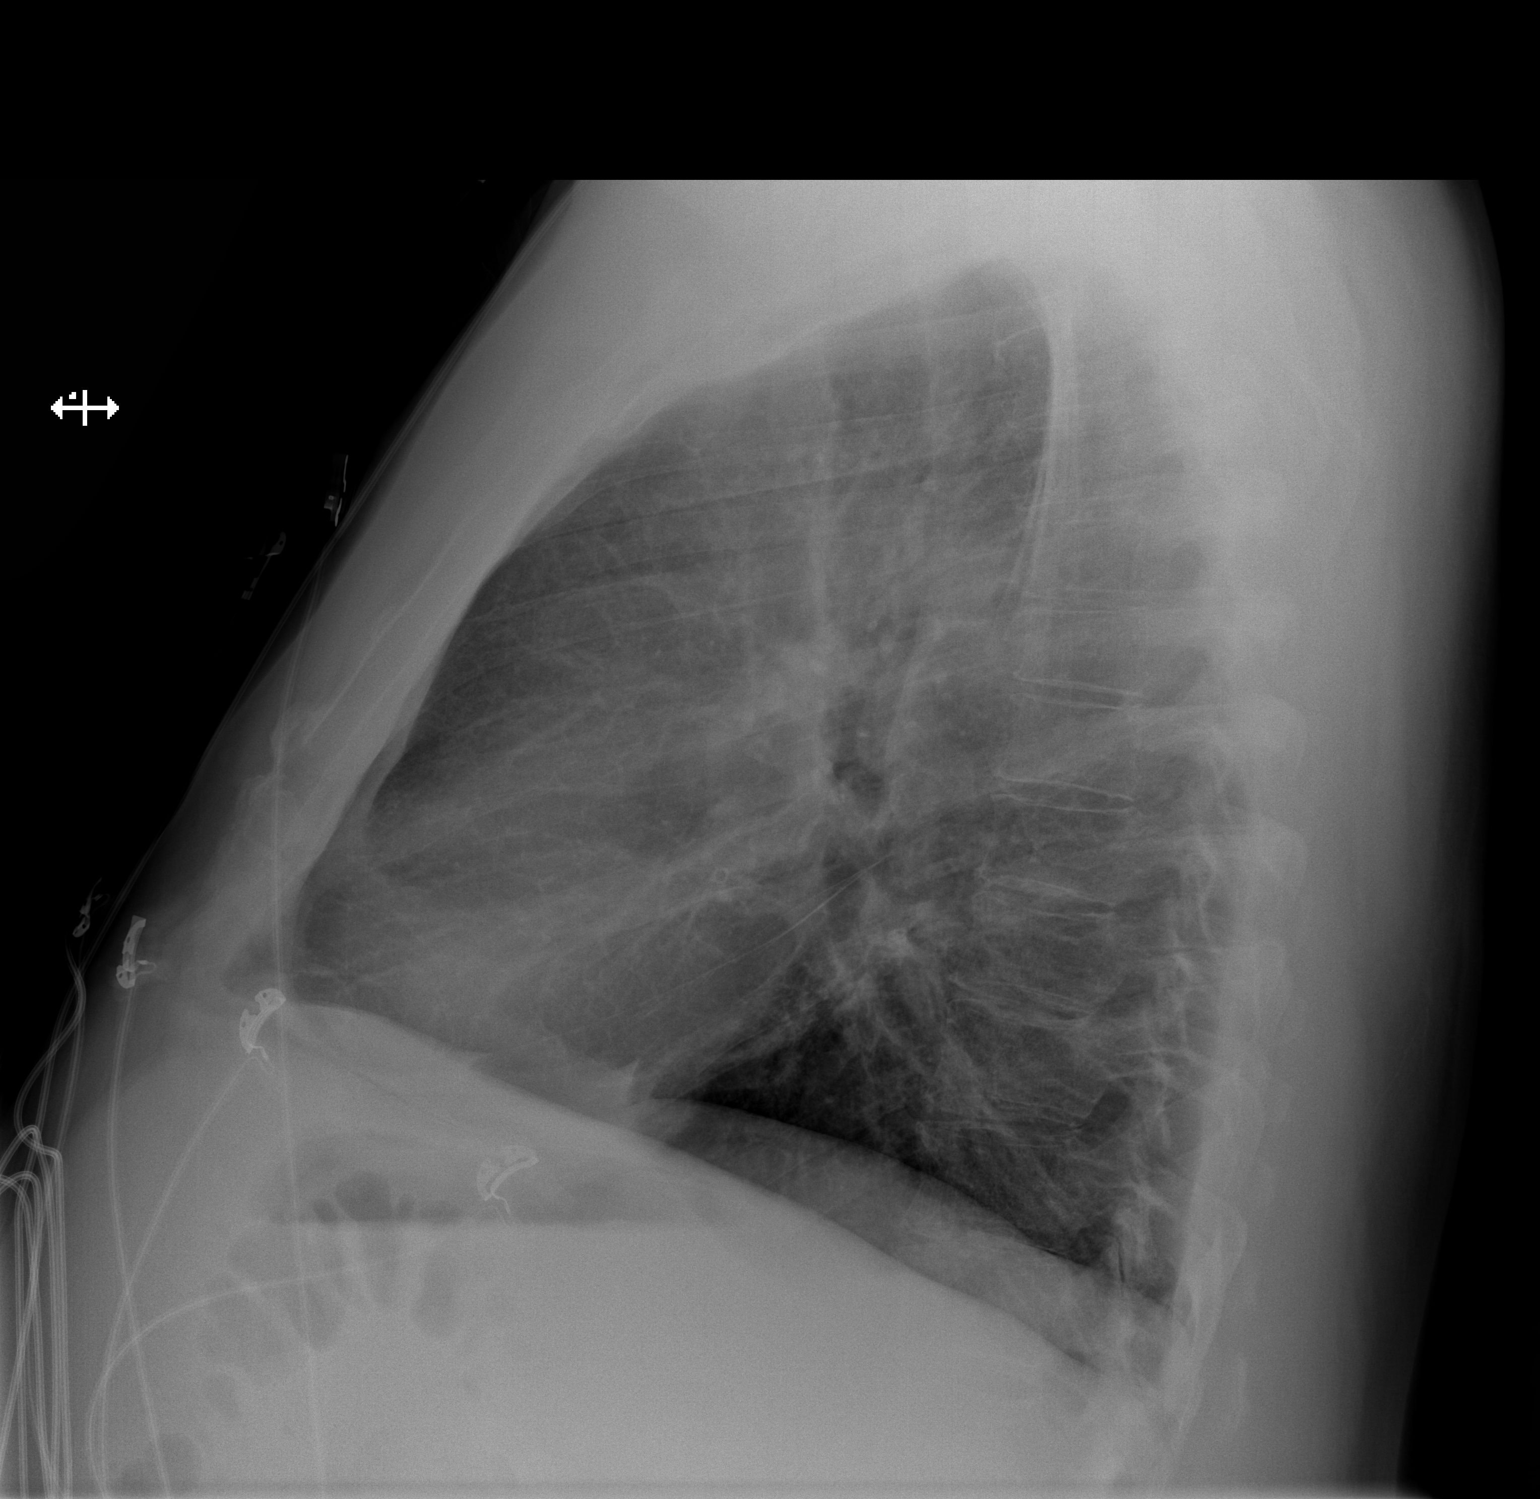

[2 of 2 positions shown; findings below may reference images not displayed]

FINDINGS: Mediastinum hilar structures normal. Heart size normal. No focal
infiltrate. No pleural effusion or pneumothorax.
IMPRESSION: No acute cardiopulmonary disease.

## 2022-02-16 ENCOUNTER — Other Ambulatory Visit: Payer: Self-pay

## 2022-02-16 DIAGNOSIS — E785 Hyperlipidemia, unspecified: Secondary | ICD-10-CM

## 2022-09-18 ENCOUNTER — Encounter (HOSPITAL_COMMUNITY): Payer: Self-pay

## 2022-09-18 ENCOUNTER — Emergency Department (HOSPITAL_COMMUNITY): Payer: 59

## 2022-09-18 ENCOUNTER — Emergency Department (HOSPITAL_COMMUNITY)
Admission: EM | Admit: 2022-09-18 | Discharge: 2022-09-18 | Disposition: A | Discharge status: Home / Self Care | Payer: 59 | Attending: Emergency Medicine | Admitting: Emergency Medicine

## 2022-09-18 ENCOUNTER — Other Ambulatory Visit: Payer: Self-pay

## 2022-09-18 DIAGNOSIS — R0602 Shortness of breath: Secondary | ICD-10-CM | POA: Diagnosis not present

## 2022-09-18 DIAGNOSIS — R42 Dizziness and giddiness: Secondary | ICD-10-CM | POA: Diagnosis not present

## 2022-09-18 DIAGNOSIS — R55 Syncope and collapse: Secondary | ICD-10-CM | POA: Insufficient documentation

## 2022-09-18 DIAGNOSIS — I10 Essential (primary) hypertension: Secondary | ICD-10-CM | POA: Diagnosis not present

## 2022-09-18 DIAGNOSIS — G8929 Other chronic pain: Secondary | ICD-10-CM

## 2022-09-18 DIAGNOSIS — Z87891 Personal history of nicotine dependence: Secondary | ICD-10-CM | POA: Diagnosis not present

## 2022-09-18 DIAGNOSIS — Z79899 Other long term (current) drug therapy: Secondary | ICD-10-CM | POA: Insufficient documentation

## 2022-09-18 DIAGNOSIS — R002 Palpitations: Secondary | ICD-10-CM | POA: Diagnosis not present

## 2022-09-18 DIAGNOSIS — R519 Headache, unspecified: Secondary | ICD-10-CM | POA: Diagnosis present

## 2022-09-18 LAB — COMPREHENSIVE METABOLIC PANEL WITH GFR
ALT: 50 U/L — ABNORMAL HIGH (ref 0–44)
AST: 32 U/L (ref 15–41)
Albumin: 4 g/dL (ref 3.5–5.0)
Alkaline Phosphatase: 48 U/L (ref 38–126)
Anion gap: 6 (ref 5–15)
BUN: 14 mg/dL (ref 6–20)
CO2: 28 mmol/L (ref 22–32)
Calcium: 8.7 mg/dL — ABNORMAL LOW (ref 8.9–10.3)
Chloride: 100 mmol/L (ref 98–111)
Creatinine, Ser: 0.63 mg/dL (ref 0.61–1.24)
GFR, Estimated: >60 mL/min
Glucose, Bld: 110 mg/dL — ABNORMAL HIGH (ref 70–99)
Potassium: 3.4 mmol/L — ABNORMAL LOW (ref 3.5–5.1)
Sodium: 134 mmol/L — ABNORMAL LOW (ref 135–145)
Total Bilirubin: 1 mg/dL (ref 0.3–1.2)
Total Protein: 7.1 g/dL (ref 6.5–8.1)

## 2022-09-18 LAB — CBC WITH DIFFERENTIAL/PLATELET
Abs Immature Granulocytes: 0.03 K/uL (ref 0.00–0.07)
Basophils Absolute: 0.1 K/uL (ref 0.0–0.1)
Basophils Relative: 1 %
Eosinophils Absolute: 0.2 K/uL (ref 0.0–0.5)
Eosinophils Relative: 4 %
HCT: 42.5 % (ref 39.0–52.0)
Hemoglobin: 14.8 g/dL (ref 13.0–17.0)
Immature Granulocytes: 1 %
Lymphocytes Relative: 27 %
Lymphs Abs: 1.6 K/uL (ref 0.7–4.0)
MCH: 33.6 pg (ref 26.0–34.0)
MCHC: 34.8 g/dL (ref 30.0–36.0)
MCV: 96.4 fL (ref 80.0–100.0)
Monocytes Absolute: 0.6 K/uL (ref 0.1–1.0)
Monocytes Relative: 10 %
Neutro Abs: 3.6 K/uL (ref 1.7–7.7)
Neutrophils Relative %: 57 %
Platelets: 170 K/uL (ref 150–400)
RBC: 4.41 MIL/uL (ref 4.22–5.81)
RDW: 11.9 % (ref 11.5–15.5)
WBC: 6.1 K/uL (ref 4.0–10.5)
nRBC: 0 % (ref 0.0–0.2)

## 2022-09-18 LAB — TROPONIN I (HIGH SENSITIVITY)
Troponin I (High Sensitivity): 2 ng/L (ref <18)
Troponin I (High Sensitivity): 2 ng/L (ref <18)

## 2022-09-18 LAB — BRAIN NATRIURETIC PEPTIDE: B Natriuretic Peptide: 33.6 pg/mL (ref 0.0–100.0)

## 2022-09-18 MED ORDER — METOCLOPRAMIDE HCL 5 MG/ML IJ SOLN
10.0000 mg | Freq: Once | INTRAMUSCULAR | Status: AC
  Administered 2022-09-18: 10 mg via INTRAVENOUS
  Filled 2022-09-18: qty 2

## 2022-09-18 MED ORDER — DEXAMETHASONE SODIUM PHOSPHATE 4 MG/ML IJ SOLN
4.0000 mg | Freq: Once | INTRAMUSCULAR | Status: AC
  Administered 2022-09-18: 4 mg via INTRAVENOUS
  Filled 2022-09-18: qty 1

## 2022-09-18 MED ORDER — ACETAMINOPHEN 500 MG PO TABS
1000.0000 mg | ORAL_TABLET | Freq: Once | ORAL | Status: AC
  Administered 2022-09-18: 1000 mg via ORAL
  Filled 2022-09-18: qty 2

## 2022-09-18 MED ORDER — IOHEXOL 350 MG/ML SOLN
75.0000 mL | Freq: Once | INTRAVENOUS | Status: AC | PRN
  Administered 2022-09-18: 75 mL via INTRAVENOUS

## 2022-09-18 MED ORDER — LACTATED RINGERS IV BOLUS
1000.0000 mL | Freq: Once | INTRAVENOUS | Status: AC
  Administered 2022-09-18: 1000 mL via INTRAVENOUS

## 2022-09-18 NOTE — ED Provider Notes (Signed)
Signed out to me at 1600 by Dr. Billy Fischer pending CT imaging.  In short this is a 62 year old male with a past medical history of hypertension that presented to the emergency department with dizziness as well as sinus pressure and headaches.  The symptoms have been ongoing however today dizziness got worse which prompted him to come to the emergency department.  On my evaluation, the patient is awake alert well-appearing in no acute distress.  He has no focal neurologic deficits on exam, he is mild right-sided maxillary facial tenderness to percussion.  He states he is currently on antibiotics.  Patient did take meloxicam prior to arrival and he will be treated with remainder of headache cocktail with Tylenol, Decadron, fluids and Reglan.  CTs are pending at this time and he will be closely reassessed.  Clinical Course as of 09/18/22 2016  Fri Sep 18, 2022  [id number] CT imaging with sinus thickening but no obvious sinusitis, severe P1 stenosis otherwise no acute findings. Patient has no neurologic deficits and is stable for discharge home with ENT and neurology follow up. [VK]    Clinical Course User Index [VK] Kemper Durie, DO      Kemper Durie, [place] 09/18/22 2016

## 2022-09-18 NOTE — ED Triage Notes (Signed)
Pt came via [facility] from home complaining of dizziness and some SOB.  Has a hx of dizziness for years. He has also had a sinus pain and pressure this week.   BP 154/93 O2 95% HR 68

## 2022-09-18 NOTE — ED Notes (Signed)
Patient given discharge instructions and follow up care. Patient verbalized understanding. IV removed with catheter intact. Patient ambulatory out of ED. 

## 2022-09-18 NOTE — ED Provider Notes (Signed)
[place] [facility]ERGENC[facility] DEPARTMENT AT [facility] Provider Note   CSN: [id number] Arrival date & time: 09/18/22  1446     History  Chief Complaint  Patient presents with   Dizziness   Shortness of Breath    Cameron Kemp is a 62 y.o. male.  HPI     62 year old male with prior history of smoking, history of hypertension, who presents with concern for facial pain, headache with associated near syncopal episodes.  10 years pain eyes, nose, cheekbones, thought from smoking, used to smoke cigarettes, then cigars  Once a year feeling like needs to pass out Face swollen from drinking and swelling  Had a spell the other week, saw PCP, saw abx, thought lingering sinus infection, not helping  More frequent spells used to be once a year and now once-twice a week, feels like going to pass out Had one today, felt shaky short of breath,pain front of head, feels like ear, eyes, nose cheekbone infection, neck hurts to the touch.  Thinks may have OSA Was on high dose prednisone and that helped head pain, when that ran out the pain came back.  Came home yesterday and today because of headache Every day constant headache When headaches worse is hard to focus, face feels like it hurts to touch No difficulty talking or walking, no other numbness/weakness When having the headache will have tigngling in left arm, for years had pain in left arm, tingling for years, part of the motivation to quit smoking. Smoking would make arm and head hurt more.  Had echocardiogram with PCP, 24yr ago went to see Cardiology. Quit smoking cigarettes, did a stress test Today had palpitations with the spell but did not usually, did also have nausea today  With these spells they usually start out as headache, followed by lightheadedness, feeling like going to pass out, shakiness, lasts 20 minutes, drinks electrolyte drink then felt better  Has been on doxycycline for past 1.5 weeks  No chest pain  Has not  smoked in about one month, thought head would improve and it has not  Ibuprofen helps a little, meloxicam (not taking ibuprofen while on that)  Prednisone helped.     Past Medical History:  Diagnosis Date   Anxiety    Arthritis    hands, wrist, elbows   At risk for sleep apnea    STOP-BANG= 5           SENT TO PCP 10-23-2016   Hemorrhoids    symptomatic   History of gastroesophageal reflux (GERD)    Hypertension    Mass of perianal area    prolapsed   Wears glasses         Home Medications Prior to Admission medications   Medication Sig Start Date End Date Taking? Authorizing Provider  doxycycline (VIBRAMYCIN) 100 MG capsule Take 100 mg by mouth 2 (two) times daily.    [provider]  hydrochlorothiazide (HYDRODIURIL) 25 MG tablet Take 25 mg by mouth every morning.    [provider]  LORazepam (ATIVAN) 0.5 MG tablet Take 0.5 mg by mouth at bedtime as needed for sleep.     [provider]  methocarbamol (ROBAXIN) 750 MG tablet Take 1 tablet (750 mg total) by mouth 4 (four) times daily as needed (use for muscle cramps/pain). 03/03/17   GMichael Boston MD  metoprolol tartrate (LOPRESSOR) 25 MG tablet Take 25 mg by mouth 2 (two) times daily.      [provider]  naproxen (NAPROSYN) 500 MG tablet Take 1 tablet (500 mg total) by mouth 2 (two) times daily with a meal. 03/03/17   Michael Boston, MD  oxyCODONE (OXY IR/ROXICODONE) 5 MG immediate release tablet Take 1-2 tablets (5-10 mg total) by mouth every 4 (four) hours as needed for moderate pain or severe pain. 03/03/17   Michael Boston, MD  predniSONE (STERAPRED UNI-PAK 21 TAB) 10 MG (21) TBPK tablet Take by mouth daily.    [provider]  Psyllium (SM FIBER POWDER PO) Take 1 scoop by mouth daily. Mixed with water    [provider]      Allergies    Penicillins    Review of Systems   Review of Systems  Physical Exam Updated Vital Signs BP (!) 147/78   Pulse (!) 56    Temp 97.7 F (36.5 C) (Oral)   Resp 16   Ht '5\' 10"'$  (1.778 m)   Wt 111.1 kg   SpO2 95%   BMI 35.15 kg/m  Physical Exam Vitals and nursing note reviewed.  Constitutional:      General: He is not in acute distress.    Appearance: He is well-developed. He is not diaphoretic.  HENT:     Head: Normocephalic and atraumatic.  Eyes:     Conjunctiva/sclera: Conjunctivae normal.  Cardiovascular:     Rate and Rhythm: Normal rate and regular rhythm.     Heart sounds: Normal heart sounds. No murmur heard.    No friction rub. No gallop.  Pulmonary:     Effort: Pulmonary effort is normal. No respiratory distress.     Breath sounds: Normal breath sounds. No wheezing or rales.  Abdominal:     General: There is no distension.     Palpations: Abdomen is soft.     Tenderness: There is no abdominal tenderness. There is no guarding.  Musculoskeletal:     Cervical back: Normal range of motion.  Skin:    General: Skin is warm and dry.  Neurological:     Mental Status: He is alert and oriented to person, place, and time.     ED Results / Procedures / Treatments   Labs (all labs ordered are listed, but only abnormal results are displayed) Labs Reviewed  COMPREHENSIVE METABOLIC PANEL - Abnormal; Notable for the following components:      Result Value   Sodium 134 (*)    Potassium 3.4 (*)    Glucose, Bld 110 (*)    Calcium 8.7 (*)    ALT 50 (*)    All other components within normal limits  CBC WITH DIFFERENTIAL/PLATELET  BRAIN NATRIURETIC PEPTIDE  TROPONIN I (HIGH SENSITIVITY)  TROPONIN I (HIGH SENSITIVITY)    EKG EKG Interpretation  Date/Time:  Friday September 18 2022 15:48:02 EST Ventricular Rate:  56 PR Interval:  173 QRS Duration: 81 QT Interval:  419 QTC Calculation: 405 R Axis:   73 Text Interpretation: Sinus rhythm Abnormal R-wave progression, late transition ST elevation, consider inferior injury No significant change since last tracing Confirmed by Leanord Asal ([id number])  on 09/18/2022 4:36:52 PM  Radiology CT Angio Neck W and/or Wo Contrast  Result Date: 09/18/2022 CLINICAL DATA:  Head and face pain for 10 years; more recent syncopal episodes; concern for aneurysm or clot EXAM: CT ANGIOGRAPHY HEAD AND NECK CT VENOGRAM TECHNIQUE: Multidetector CT imaging of the head and neck was performed using the standard protocol during bolus administration of intravenous contrast. Multiplanar CT image reconstructions and MIPs were obtained to evaluate the  vascular anatomy. Carotid stenosis measurements (when applicable) are obtained utilizing NASCET criteria, using the distal internal carotid diameter as the denominator. Venographic phase images of the brain were obtained following the administration of intravenous contrast. Multiplanar reformats and maximum intensity projections were generated. RADIATION DOSE REDUCTION: This exam was performed according to the departmental dose-optimization program which includes automated exposure control, adjustment of the mA and/or kV according to patient size and/or use of iterative reconstruction technique. CONTRAST:  63m OMNIPAQUE IOHEXOL 350 MG/ML SOLN COMPARISON:  CT head 07/17/2011, no prior CTA or CT V FINDINGS: CT HEAD FINDINGS Brain: No evidence of acute infarct, hemorrhage, mass, mass effect, or midline shift. No hydrocephalus or extra-axial fluid collection. Vascular: No hyperdense vessel. Skull: Normal. Negative for fracture or focal lesion. Sinuses/Orbits: Mucosal thickening in the ethmoid air cells and maxillary sinuses. No acute finding in the orbits. Other: The mastoid air cells are well aerated. CTA NECK FINDINGS Aortic arch: 4 vessel arch, with the origin of the left vertebral artery from the aorta. Imaged portion shows no evidence of aneurysm or dissection. No significant stenosis of the major arch vessel origins. Minimal aortic atherosclerosis. Right carotid system: No evidence of dissection, occlusion, or hemodynamically significant  stenosis (greater than 50%). Atherosclerotic disease at the bifurcation and in the proximal ICA is not hemodynamically significant. Left carotid system: No evidence of dissection, occlusion, or hemodynamically significant stenosis (greater than 50%). Atherosclerotic disease at the bifurcation and in the proximal ICA is not hemodynamically significant. Vertebral arteries: No evidence of dissection, occlusion, or hemodynamically significant stenosis (greater than 50%). Skeleton: No acute osseous abnormality. Degenerative changes in the cervical spine. Other neck: 1 cm peripherally calcified lesion in the right thyroid lobe, for which no follow-up indicated. (Reference: J Am Coll Radiol. 2015 Feb;12(2): 143-50) Upper chest: Emphysema. No focal pulmonary opacity or pleural effusion. Review of the MIP images confirms the above findings CTA HEAD FINDINGS Anterior circulation: Both internal carotid arteries are patent to the termini, without significant stenosis. A1 segments patent, hypoplastic on the left. Normal anterior communicating artery. Anterior cerebral arteries are patent to their distal aspects. No M1 stenosis or occlusion. MCA branches perfused and symmetric. Posterior circulation: Vertebral arteries patent to the vertebrobasilar junction without stenosis. Posterior inferior cerebellar arteries patent proximally. Basilar patent to its distal aspect. Superior cerebellar arteries patent proximally. Patent P1 segments, although there is severe stenosis in the proximal right P1 segment (series 6 image 121-122). PCAs a somewhat irregular but perfused to their distal aspects without stenosis. The bilateral posterior communicating arteries are not visualized. Anatomic variants: None significant. Review of the MIP images confirms the above findings CTV FINDINGS Superior sagittal sinus: Normal. Straight sinus: Normal. Inferior sagittal sinus, vein of Galen and internal cerebral veins: Normal. Transverse sinuses: Normal.  Sigmoid sinuses: Normal. Visualized jugular veins: Normal IMPRESSION: 1. No acute intracranial process. 2. No intracranial large vessel occlusion. 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 3. No dural venous sinus thrombosis. 4. No hemodynamically significant stenosis in the neck. 5.  Emphysema (ICD10-J43.9). Electronically Signed   By: AMerilyn BabaM.D.   On: 09/18/2022 19:47   CT Angio Head W or Wo Contrast  Result Date: 09/18/2022 CLINICAL DATA:  Head and face pain for 10 years; more recent syncopal episodes; concern for aneurysm or clot EXAM: CT ANGIOGRAPHY HEAD AND NECK CT VENOGRAM TECHNIQUE: Multidetector CT imaging of the head and neck was performed using the standard protocol during bolus administration of intravenous contrast. Multiplanar CT image reconstructions and MIPs  were obtained to evaluate the vascular anatomy. Carotid stenosis measurements (when applicable) are obtained utilizing NASCET criteria, using the distal internal carotid diameter as the denominator. Venographic phase images of the brain were obtained following the administration of intravenous contrast. Multiplanar reformats and maximum intensity projections were generated. RADIATION DOSE REDUCTION: This exam was performed according to the departmental dose-optimization program which includes automated exposure control, adjustment of the mA and/or kV according to patient size and/or use of iterative reconstruction technique. CONTRAST:  31m OMNIPAQUE IOHEXOL 350 MG/ML SOLN COMPARISON:  CT head 07/17/2011, no prior CTA or CT V FINDINGS: CT HEAD FINDINGS Brain: No evidence of acute infarct, hemorrhage, mass, mass effect, or midline shift. No hydrocephalus or extra-axial fluid collection. Vascular: No hyperdense vessel. Skull: Normal. Negative for fracture or focal lesion. Sinuses/Orbits: Mucosal thickening in the ethmoid air cells and maxillary sinuses. No acute finding in the orbits. Other: The mastoid air cells are well  aerated. CTA NECK FINDINGS Aortic arch: 4 vessel arch, with the origin of the left vertebral artery from the aorta. Imaged portion shows no evidence of aneurysm or dissection. No significant stenosis of the major arch vessel origins. Minimal aortic atherosclerosis. Right carotid system: No evidence of dissection, occlusion, or hemodynamically significant stenosis (greater than 50%). Atherosclerotic disease at the bifurcation and in the proximal ICA is not hemodynamically significant. Left carotid system: No evidence of dissection, occlusion, or hemodynamically significant stenosis (greater than 50%). Atherosclerotic disease at the bifurcation and in the proximal ICA is not hemodynamically significant. Vertebral arteries: No evidence of dissection, occlusion, or hemodynamically significant stenosis (greater than 50%). Skeleton: No acute osseous abnormality. Degenerative changes in the cervical spine. Other neck: 1 cm peripherally calcified lesion in the right thyroid lobe, for which no follow-up indicated. (Reference: J Am Coll Radiol. 2015 Feb;12(2): 143-50) Upper chest: Emphysema. No focal pulmonary opacity or pleural effusion. Review of the MIP images confirms the above findings CTA HEAD FINDINGS Anterior circulation: Both internal carotid arteries are patent to the termini, without significant stenosis. A1 segments patent, hypoplastic on the left. Normal anterior communicating artery. Anterior cerebral arteries are patent to their distal aspects. No M1 stenosis or occlusion. MCA branches perfused and symmetric. Posterior circulation: Vertebral arteries patent to the vertebrobasilar junction without stenosis. Posterior inferior cerebellar arteries patent proximally. Basilar patent to its distal aspect. Superior cerebellar arteries patent proximally. Patent P1 segments, although there is severe stenosis in the proximal right P1 segment (series 6 image 121-122). PCAs a somewhat irregular but perfused to their distal  aspects without stenosis. The bilateral posterior communicating arteries are not visualized. Anatomic variants: None significant. Review of the MIP images confirms the above findings CTV FINDINGS Superior sagittal sinus: Normal. Straight sinus: Normal. Inferior sagittal sinus, vein of Galen and internal cerebral veins: Normal. Transverse sinuses: Normal. Sigmoid sinuses: Normal. Visualized jugular veins: Normal IMPRESSION: 1. No acute intracranial process. 2. No intracranial large vessel occlusion. 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 3. No dural venous sinus thrombosis. 4. No hemodynamically significant stenosis in the neck. 5.  Emphysema (ICD10-J43.9). Electronically Signed   By: AMerilyn BabaM.D.   On: 09/18/2022 19:47   CT VENOGRAM HEAD  Result Date: 09/18/2022 CLINICAL DATA:  Head and face pain for 10 years; more recent syncopal episodes; concern for aneurysm or clot EXAM: CT ANGIOGRAPHY HEAD AND NECK CT VENOGRAM TECHNIQUE: Multidetector CT imaging of the head and neck was performed using the standard protocol during bolus administration of intravenous contrast. Multiplanar CT image reconstructions and  MIPs were obtained to evaluate the vascular anatomy. Carotid stenosis measurements (when applicable) are obtained utilizing NASCET criteria, using the distal internal carotid diameter as the denominator. Venographic phase images of the brain were obtained following the administration of intravenous contrast. Multiplanar reformats and maximum intensity projections were generated. RADIATION DOSE REDUCTION: This exam was performed according to the departmental dose-optimization program which includes automated exposure control, adjustment of the mA and/or kV according to patient size and/or use of iterative reconstruction technique. CONTRAST:  66m OMNIPAQUE IOHEXOL 350 MG/ML SOLN COMPARISON:  CT head 07/17/2011, no prior CTA or CT V FINDINGS: CT HEAD FINDINGS Brain: No evidence of acute infarct,  hemorrhage, mass, mass effect, or midline shift. No hydrocephalus or extra-axial fluid collection. Vascular: No hyperdense vessel. Skull: Normal. Negative for fracture or focal lesion. Sinuses/Orbits: Mucosal thickening in the ethmoid air cells and maxillary sinuses. No acute finding in the orbits. Other: The mastoid air cells are well aerated. CTA NECK FINDINGS Aortic arch: 4 vessel arch, with the origin of the left vertebral artery from the aorta. Imaged portion shows no evidence of aneurysm or dissection. No significant stenosis of the major arch vessel origins. Minimal aortic atherosclerosis. Right carotid system: No evidence of dissection, occlusion, or hemodynamically significant stenosis (greater than 50%). Atherosclerotic disease at the bifurcation and in the proximal ICA is not hemodynamically significant. Left carotid system: No evidence of dissection, occlusion, or hemodynamically significant stenosis (greater than 50%). Atherosclerotic disease at the bifurcation and in the proximal ICA is not hemodynamically significant. Vertebral arteries: No evidence of dissection, occlusion, or hemodynamically significant stenosis (greater than 50%). Skeleton: No acute osseous abnormality. Degenerative changes in the cervical spine. Other neck: 1 cm peripherally calcified lesion in the right thyroid lobe, for which no follow-up indicated. (Reference: J Am Coll Radiol. 2015 Feb;12(2): 143-50) Upper chest: Emphysema. No focal pulmonary opacity or pleural effusion. Review of the MIP images confirms the above findings CTA HEAD FINDINGS Anterior circulation: Both internal carotid arteries are patent to the termini, without significant stenosis. A1 segments patent, hypoplastic on the left. Normal anterior communicating artery. Anterior cerebral arteries are patent to their distal aspects. No M1 stenosis or occlusion. MCA branches perfused and symmetric. Posterior circulation: Vertebral arteries patent to the vertebrobasilar  junction without stenosis. Posterior inferior cerebellar arteries patent proximally. Basilar patent to its distal aspect. Superior cerebellar arteries patent proximally. Patent P1 segments, although there is severe stenosis in the proximal right P1 segment (series 6 image 121-122). PCAs a somewhat irregular but perfused to their distal aspects without stenosis. The bilateral posterior communicating arteries are not visualized. Anatomic variants: None significant. Review of the MIP images confirms the above findings CTV FINDINGS Superior sagittal sinus: Normal. Straight sinus: Normal. Inferior sagittal sinus, vein of Galen and internal cerebral veins: Normal. Transverse sinuses: Normal. Sigmoid sinuses: Normal. Visualized jugular veins: Normal IMPRESSION: 1. No acute intracranial process. 2. No intracranial large vessel occlusion. 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 3. No dural venous sinus thrombosis. 4. No hemodynamically significant stenosis in the neck. 5.  Emphysema (ICD10-J43.9). Electronically Signed   By: AMerilyn BabaM.D.   On: 09/18/2022 19:47   DG Chest 2 View  Result Date: 09/18/2022 CLINICAL DATA:  Shortness of breath EXAM: CHEST - 2 VIEW COMPARISON:  09/12/2019 FINDINGS: The heart size and mediastinal contours are within normal limits. Both lungs are clear. The visualized skeletal structures are unremarkable. IMPRESSION: No active cardiopulmonary disease. Electronically Signed   By: NDavina PokeD.O.  On: 09/18/2022 16:32    Procedures Procedures    Medications Ordered in ED Medications  lactated ringers bolus 1,000 mL (0 mLs Intravenous Stopped 09/18/22 2030)  metoCLOPramide (REGLAN) injection 10 mg (10 mg Intravenous Given 09/18/22 1832)  acetaminophen (TYLENOL) tablet 1,000 mg (1,000 mg Oral Given 09/18/22 1832)  dexamethasone (DECADRON) injection 4 mg (4 mg Intravenous Given 09/18/22 1831)  iohexol (OMNIPAQUE) 350 MG/ML injection 75 mL (75 mLs Intravenous Contrast Given  09/18/22 1846)    ED Course/ Medical Decision Making/ A&P Clinical Course as of 09/18/22 2351  Fri Sep 18, 2022  1957 CT imaging with sinus thickening but no obvious sinusitis, severe P1 stenosis otherwise no acute findings. Patient has no neurologic deficits and is stable for discharge home with ENT and neurology follow up. [VK]    Clinical Course User Index [VK] Kemper Durie, DO      62 year old male with prior history of smoking, history of hypertension, who presents with concern for facial pain, headache with associated near syncopal episodes.  DDx includes ICH, DVT, arrhythmia, ACS, PE, anemia, electrolyte abnormality.  Today, with his near syncopal episode he also had associated palpitations, nausea and shortness of breath.  The dyspnea has resolved and have low suspicion for pulmonary embolus by his history and exam.  Will order troponin, chest x-ray to evaluate for signs of ACS, pneumonia, pneumothorax, or pulmonary edema.  Follow-up with cardiology for palpitations and near syncopal episodes may be reasonable with no other etiology found.   When his severe headaches, with near syncope, as well as symptoms that may be consistent with chronic sinusitis, ordered CTA to evaluate for signs of aneurysm or ICH, as well as CT venogram to evaluate for cerebral venous sinus thrombosis. Discussed may need ENT follow up pending results.  Care signed out at 5PM>           Final Clinical Impression(s) / ED Diagnoses Final diagnoses:  Chronic nonintractable headache, unspecified headache type  Dizziness    Rx / DC Orders ED Discharge Orders          Ordered    Ambulatory referral to Neurology       Comments: An appointment is requested in approximately: 4 weeks   09/18/22 2014              Gareth Morgan, MD 09/18/22 2351

## 2022-09-18 NOTE — Discharge Instructions (Addendum)
You were seen in the emergency department for your headaches and your dizziness.  Your workup here showed no signs of abnormal heart rhythms, you are anemic or dehydrated and your electrolytes appeared normal.  Your CAT scans showed no obvious signs of stroke though you do have narrowing of one of your blood vessels.  You can follow-up with neurology regarding your headaches and your narrowing of your blood vessel and you can follow-up with ENT regarding your recurrent sinusitis.  You should return to the emergency department for significantly worsening headaches, repetitive vomiting, numbness or weakness on one side the body compared to the other, you pass out or if you have any other new or concerning symptoms.

## 2022-09-21 ENCOUNTER — Encounter: Payer: Self-pay | Admitting: Neurology

## 2022-09-30 NOTE — Progress Notes (Signed)
NEUROLOGY CONSULTATION NOTE  Cameron Kemp MRN: [id number] DOB: 08/24/1960  Referring provider: Leanord Asal, DO (ED referral) Primary care provider: Deon Pilling, NP  Reason for consult:  headache  Assessment/Plan:   Migraine without aura with associated cervical radiculopathy, likely was aggravated by tobacco smoke.  Now improving Dizziness, may have been due to too much antihypertensive medication vs smoking Left posterior cerebral artery stenosis, incidental finding Hypertension Tobacco smoker   He wishes to monitor for continued improvement in headaches now that he has stopped smoking and drinking Due to the intracranial stenosis, advised to start ASA 81mg  daily Limit use of pain relievers to no more than 2 days out of week to prevent risk of rebound or medication-overuse headache. Normotensive blood pressure Continue smoking cessation Follow up 5 months.   Subjective:  Cameron Kemp is a 62 year old male with HTN and arthritis who presents for headaches.  History supplemented by ED note. CTA and CTV personally reviewed  Pressure - stopped smoking and felt better - started cigars left arm pain numbness in hand  cut down on beer Pounding, fuzzy vision, dizzy photo, allodynia on scalp, back of neck pain - ice daily Daily but dull and sometimes without headache - meloxicam daily turmeric, B1, omega D3  Felt was going to pass out  He has had headache and facial pain since his 7s.  He would have severe pressure in his face and around his eyes as well as severe pounding headache.  Associated with blurred vision, dizziness, photophobia and allodynia of the scalp.  Headache would seem to stem from back of head and radiate down the left arm with numbness in the hand.  It was initially believed to be related to smoking.  He quit smoking cigarettes after many years and they subsided.  About 5 years ago, he started smoking cigars and they returned.  They were daily and  persistent.  He stopped smoking cigars as well as drinking beer several weeks ago.  However, the headaches persisted.  Then it was believed to be related to sinusitis and has been treated with antibiotics without relief.  He also had been experiencing dizziness with sensation that he would pass out.  Due to worsening dizziness, he went to the ED on 3/8.  CTA head and neck and CTV head showed severe proximal right P1 PCA stenosis but no acute intracranial abnormality, sinusitis, large vessel occlusion or dural venous sinus thrombosis.  He started cutting back on his HCTZ to every other day dosing and the dizziness has improved.  Over the past 2 weeks, headaches have improved.  They are still daily but much less severe and sometimes he is without headache.    Past NSAIDS/analgesics:  ibuprofen, acetaminophen Past abortive triptans:  none Past abortive ergotamine:  none Past muscle relaxants:  Robaxine Past anti-emetic:  none Past antihypertensive medications:  none Past antidepressant medications:  none Past anticonvulsant medications:  none Past anti-CGRP:  none Past vitamins/Herbal/Supplements:  none Past antihistamines/decongestants:  meclizine Other past therapies:  none  Current NSAIDS/analgesics:  none Current triptans:  none Current ergotamine:  none Current anti-emetic:  none Current muscle relaxants:  none Current Antihypertensive medications:  metoprolol tartrate 25mg  BID, HCTZ Current Antidepressant medications:  none Current Anticonvulsant medications:  none Current anti-CGRP:  none Current Vitamins/Herbal/Supplements:  B12, turmeric, D3, omega 3 fatty acid Current Antihistamines/Decongestants:  none Other therapy:  none   PAST MEDICAL HISTORY: Past Medical History:  Diagnosis Date   Anxiety  Arthritis    hands, wrist, elbows   At risk for sleep apnea    STOP-BANG= 5           SENT TO PCP 10-23-2016   Hemorrhoids    symptomatic   History of gastroesophageal reflux  (GERD)    Hypertension    Mass of perianal area    prolapsed   Wears glasses     PAST SURGICAL HISTORY: Past Surgical History:  Procedure Laterality Date   COLONOSCOPY  08/2016   EVALUATION UNDER ANESTHESIA WITH HEMORRHOIDECTOMY N/A 10/28/2016   Procedure: EXAM UNDER ANESTHESIA WITH HEMORRHOIDECTOMY HEMORRHOIDAL LIGATION PEXY;  Surgeon: Michael Boston, MD;  Location: [place];  Service: General;  Laterality: N/A;   INGUINAL HERNIA REPAIR Bilateral 03/03/2017   Procedure: LAPAROSCOPIC EXPLORATION AND REPAIR OF BILATERAL INGUINAL HERNIAS WITH MESH ;  Surgeon: Michael Boston, MD;  Location: [facility];  Service: General;  Laterality: Bilateral;   INSERTION OF MESH N/A 03/03/2017   Procedure: INSERTION OF MESH;  Surgeon: Michael Boston, MD;  Location: [facility];  Service: General;  Laterality: N/A;   MASS EXCISION N/A 10/28/2016   Procedure: EXCISION ANAL CANAL MASS;  Surgeon: Michael Boston, MD;  Location: [place];  Service: General;  Laterality: N/A;   TONSILLECTOMY  age 4   [place] N/A 03/03/2017   Procedure: LAPAROSCOPIC VENTRAL WALL HERNIA;  Surgeon: Michael Boston, MD;  Location: [facility];  Service: General;  Laterality: N/A;    MEDICATIONS: Current Outpatient Medications on File Prior to Visit  Medication Sig Dispense Refill   doxycycline (VIBRAMYCIN) 100 MG capsule Take 100 mg by mouth 2 (two) times daily.     hydrochlorothiazide (HYDRODIURIL) 25 MG tablet Take 25 mg by mouth every morning.     LORazepam (ATIVAN) 0.5 MG tablet Take 0.5 mg by mouth at bedtime as needed for sleep.      methocarbamol (ROBAXIN) 750 MG tablet Take 1 tablet (750 mg total) by mouth 4 (four) times daily as needed (use for muscle cramps/pain). 30 tablet 2   metoprolol tartrate (LOPRESSOR) 25 MG tablet Take 25 mg by mouth 2 (two) times daily.       naproxen (NAPROSYN) 500 MG tablet Take 1 tablet (500 mg total) by mouth 2 (two) times daily with a meal. 40 tablet 1   oxyCODONE  (OXY IR/ROXICODONE) 5 MG immediate release tablet Take 1-2 tablets (5-10 mg total) by mouth every 4 (four) hours as needed for moderate pain or severe pain. 40 tablet 0   predniSONE (STERAPRED UNI-PAK 21 TAB) 10 MG (21) TBPK tablet Take by mouth daily.     Psyllium (SM FIBER POWDER PO) Take 1 scoop by mouth daily. Mixed with water     No current facility-administered medications on file prior to visit.    ALLERGIES: Allergies  Allergen Reactions   Penicillins Rash    Has patient had a PCN reaction causing immediate rash, facial/tongue/throat swelling, SOB or lightheadedness with hypotension: YES Has patient had a PCN reaction causing severe rash involving mucus membranes or skin necrosis: NO Has patient had a PCN reaction that required hospitalization NO Has patient had a PCN reaction occurring within the last 10 years: No If all of the above answers are "NO", then may proceed with Cephalosporin use.    FAMILY HISTORY: Family History  Problem Relation Age of Onset   Hyperlipidemia Brother    Hypertension Brother    Stroke Paternal Grandmother    Hypertension Brother  Objective:  Blood pressure (!) 150/74, pulse 62, height 5\' 10"  (1.778 m), weight 249 lb 9.6 oz (113.2 kg), SpO2 96 %. General: No acute distress.  Patient appears well-groomed.   Head:  Normocephalic/atraumatic Eyes:  fundi examined but not visualized Neck: supple, no paraspinal tenderness, full range of motion Back: No paraspinal tenderness Heart: regular rate and rhythm Lungs: Clear to auscultation bilaterally. Vascular: No carotid bruits. Neurological Exam: Mental status: alert and oriented to person, place, and time, speech fluent and not dysarthric, language intact. Cranial nerves: CN I: not tested CN II: pupils equal, round and reactive to light, visual fields intact CN III, IV, VI:  full range of motion, no nystagmus, no ptosis CN V: facial sensation intact. CN VII: upper and lower face symmetric CN  VIII: hearing intact CN IX, X: gag intact, uvula midline CN XI: sternocleidomastoid and trapezius muscles intact CN XII: tongue midline Bulk & Tone: normal, no fasciculations. Motor:  muscle strength 5/5 throughout Sensation:  Pinprick, temperature and vibratory sensation intact. Deep Tendon Reflexes:  2+ throughout,  toes downgoing.   Finger to nose testing:  Without dysmetria.   Heel to shin:  Without dysmetria.   Gait:  Normal station and stride.  Romberg negative.    Thank you for allowing me to take part in the care of this patient.  Metta Clines, DO  CC: Deon Pilling, NP

## 2022-10-02 ENCOUNTER — Ambulatory Visit: Payer: 59 | Admitting: Neurology

## 2022-10-02 ENCOUNTER — Encounter: Payer: Self-pay | Admitting: Neurology

## 2022-10-02 VITALS — BP 150/74 | HR 62 | Ht 70.0 in | Wt 249.6 lb

## 2022-10-02 DIAGNOSIS — R42 Dizziness and giddiness: Secondary | ICD-10-CM

## 2022-10-02 DIAGNOSIS — M5412 Radiculopathy, cervical region: Secondary | ICD-10-CM

## 2022-10-02 DIAGNOSIS — I669 Occlusion and stenosis of unspecified cerebral artery: Secondary | ICD-10-CM | POA: Diagnosis not present

## 2022-10-02 DIAGNOSIS — G43009 Migraine without aura, not intractable, without status migrainosus: Secondary | ICD-10-CM

## 2022-10-02 DIAGNOSIS — Z720 Tobacco use: Secondary | ICD-10-CM

## 2022-10-02 NOTE — Patient Instructions (Signed)
The headaches are likely migraine with pinched nerve in neck, likely aggravated by smoking The dizziness is likely related to the blood pressure medication or smoking as well The CT shows one of your arteries narrowed.  This is an incidental finding but I would start aspirin 81mg  daily Follow up in 5 months.

## 2023-01-18 ENCOUNTER — Ambulatory Visit: Payer: Managed Care, Other (non HMO) | Admitting: Neurology

## 2023-03-05 NOTE — Progress Notes (Deleted)
NEUROLOGY FOLLOW UP OFFICE NOTE  [name] [id number]  Assessment/Plan:   Migraine without aura with associated cervical radiculopathy, likely was aggravated by tobacco smoke.  Now improving Dizziness, may have been due to too much antihypertensive medication vs smoking Left posterior cerebral artery stenosis, incidental finding Hypertension Tobacco smoker     He wishes to monitor for continued improvement in headaches now that he has stopped smoking and drinking Due to the intracranial stenosis, advised to start ASA 81mg  daily Limit use of pain relievers to no more than 2 days out of week to prevent risk of rebound or medication-overuse headache. Normotensive blood pressure Continue smoking cessation Follow up 5 months.  Subjective:  Cameron Kemp is a 62 year old male with HTN and arthritis who follows up for headache.   UPDATE: Intensity:  *** Duration:  *** Frequency:  *** Frequency of abortive medication: *** Current NSAIDS/analgesics:  ASA 81mg  daily Current triptans:  none Current ergotamine:  none Current anti-emetic:  none Current muscle relaxants:  none Current Antihypertensive medications:  metoprolol tartrate 25mg  BID, HCTZ Current Antidepressant medications:  none Current Anticonvulsant medications:  none Current anti-CGRP:  none Current Vitamins/Herbal/Supplements:  B12, turmeric, D3, omega 3 fatty acid Current Antihistamines/Decongestants:  none Other therapy:  none  HISTORY: He has had headache and facial pain since his 40s.  He would have severe pressure in his face and around his eyes as well as severe pounding headache.  Associated with blurred vision, dizziness, photophobia and allodynia of the scalp.  Headache would seem to stem from back of head and radiate down the left arm with numbness in the hand.  It was initially believed to be related to smoking.  He quit smoking cigarettes after many years and they subsided.  About 5 years ago, he  started smoking cigars and they returned.  They were daily and persistent.  He stopped smoking cigars as well as drinking beer several weeks ago.  However, the headaches persisted.  Then it was believed to be related to sinusitis and has been treated with antibiotics without relief.  He also had been experiencing dizziness with sensation that he would pass out.  Due to worsening dizziness, he went to the ED on 3/8.  CTA head and neck and CTV head showed severe proximal right P1 PCA stenosis but no acute intracranial abnormality, sinusitis, large vessel occlusion or dural venous sinus thrombosis.  He started cutting back on his HCTZ to every other day dosing and the dizziness has improved.  Over the past 2 weeks, headaches have improved.  They are still daily but much less severe and sometimes he is without headache.     Past NSAIDS/analgesics:  ibuprofen, acetaminophen Past abortive triptans:  none Past abortive ergotamine:  none Past muscle relaxants:  Robaxine Past anti-emetic:  none Past antihypertensive medications:  none Past antidepressant medications:  none Past anticonvulsant medications:  none Past anti-CGRP:  none Past vitamins/Herbal/Supplements:  none Past antihistamines/decongestants:  meclizine Other past therapies:  none     PAST MEDICAL HISTORY: Past Medical History:  Diagnosis Date   Anxiety    Arthritis    hands, wrist, elbows   At risk for sleep apnea    STOP-BANG= 5           SENT TO PCP 10-23-2016   Hemorrhoids    symptomatic   History of gastroesophageal reflux (GERD)    Hypertension    Mass of perianal area    prolapsed   Wears  glasses     MEDICATIONS: Current Outpatient Medications on File Prior to Visit  Medication Sig Dispense Refill   calcium-vitamin D (OSCAL WITH D) 500-5 MG-MCG tablet Take 1 tablet by mouth.     cyanocobalamin (VITAMIN B12) 1000 MCG tablet Take 1,000 mcg by mouth daily.     hydrochlorothiazide (HYDRODIURIL) 25 MG tablet Take 25 mg  by mouth every morning. 1/2 tab every other day     LORazepam (ATIVAN) 0.5 MG tablet Take 0.5 mg by mouth at bedtime as needed for sleep.      methocarbamol (ROBAXIN) 750 MG tablet Take 1 tablet (750 mg total) by mouth 4 (four) times daily as needed (use for muscle cramps/pain). 30 tablet 2   metoprolol tartrate (LOPRESSOR) 25 MG tablet Take 25 mg by mouth 2 (two) times daily.       Psyllium (SM FIBER POWDER PO) Take 1 scoop by mouth daily. Mixed with water     Turmeric (QC TUMERIC COMPLEX PO) Take by mouth.     No current facility-administered medications on file prior to visit.    ALLERGIES: Allergies  Allergen Reactions   Penicillins Rash    Has patient had a PCN reaction causing immediate rash, facial/tongue/throat swelling, SOB or lightheadedness with hypotension: YES Has patient had a PCN reaction causing severe rash involving mucus membranes or skin necrosis: NO Has patient had a PCN reaction that required hospitalization NO Has patient had a PCN reaction occurring within the last 10 years: No If all of the above answers are "NO", then may proceed with Cephalosporin use.    FAMILY HISTORY: Family History  Problem Relation Age of Onset   Hyperlipidemia Brother    Hypertension Brother    Hypertension Brother    Parkinsonism Paternal Uncle    Alzheimer's disease Maternal Grandmother    Stroke Maternal Grandfather       Objective:  *** General: No acute distress.  Patient appears well-groomed.   Head:  Normocephalic/atraumatic Eyes:  Fundi examined but not visualized Neck: supple, no paraspinal tenderness, full range of motion Heart:  Regular rate and rhythm Neurological Exam: ***   Shon Millet, DO  CC: [name], NP

## 2023-03-08 ENCOUNTER — Encounter: Payer: Self-pay | Admitting: Neurology

## 2023-03-08 ENCOUNTER — Ambulatory Visit: Payer: 59 | Admitting: Neurology

## 2023-03-08 DIAGNOSIS — Z029 Encounter for administrative examinations, unspecified: Secondary | ICD-10-CM
